# Patient Record
Sex: Female | Born: 1980 | Race: White | Hispanic: No | Marital: Married | Smoking: Former smoker
Health system: Southern US, Community
[De-identification: ages and names within clinical notes are randomized; demographics above are authoritative.]

## PROBLEM LIST (undated history)

## (undated) ENCOUNTER — Inpatient Hospital Stay (HOSPITAL_COMMUNITY): Payer: Self-pay

## (undated) DIAGNOSIS — E039 Hypothyroidism, unspecified: Secondary | ICD-10-CM

## (undated) DIAGNOSIS — F329 Major depressive disorder, single episode, unspecified: Secondary | ICD-10-CM

## (undated) DIAGNOSIS — T7840XA Allergy, unspecified, initial encounter: Secondary | ICD-10-CM

## (undated) DIAGNOSIS — F32A Depression, unspecified: Secondary | ICD-10-CM

## (undated) DIAGNOSIS — B009 Herpesviral infection, unspecified: Secondary | ICD-10-CM

## (undated) DIAGNOSIS — F419 Anxiety disorder, unspecified: Secondary | ICD-10-CM

## (undated) DIAGNOSIS — O34219 Maternal care for unspecified type scar from previous cesarean delivery: Secondary | ICD-10-CM

## (undated) HISTORY — DX: Depression, unspecified: F32.A

## (undated) HISTORY — DX: Major depressive disorder, single episode, unspecified: F32.9

## (undated) HISTORY — DX: Hypothyroidism, unspecified: E03.9

## (undated) HISTORY — DX: Maternal care for unspecified type scar from previous cesarean delivery: O34.219

## (undated) HISTORY — PX: WISDOM TOOTH EXTRACTION: SHX21

## (undated) HISTORY — DX: Allergy, unspecified, initial encounter: T78.40XA

## (undated) HISTORY — DX: Herpesviral infection, unspecified: B00.9

## (undated) HISTORY — PX: INDUCED ABORTION: SHX677

## (undated) HISTORY — DX: Anxiety disorder, unspecified: F41.9

---
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
## 2006-01-26 ENCOUNTER — Emergency Department (HOSPITAL_COMMUNITY): Admission: EM | Admit: 2006-01-26 | Discharge: 2006-01-26 | Payer: Self-pay | Admitting: Emergency Medicine

## 2009-04-19 ENCOUNTER — Emergency Department (HOSPITAL_COMMUNITY): Admission: EM | Admit: 2009-04-19 | Discharge: 2009-04-19 | Payer: Self-pay | Admitting: Emergency Medicine

## 2010-01-11 IMAGING — CR DG CHEST 1V PORT
1 series · 1 of 1 positions shown · non-contrast
Comparison: None

CLINICAL DATA: Altered mental status

PORTABLE CHEST - 1 VIEW

[AP]
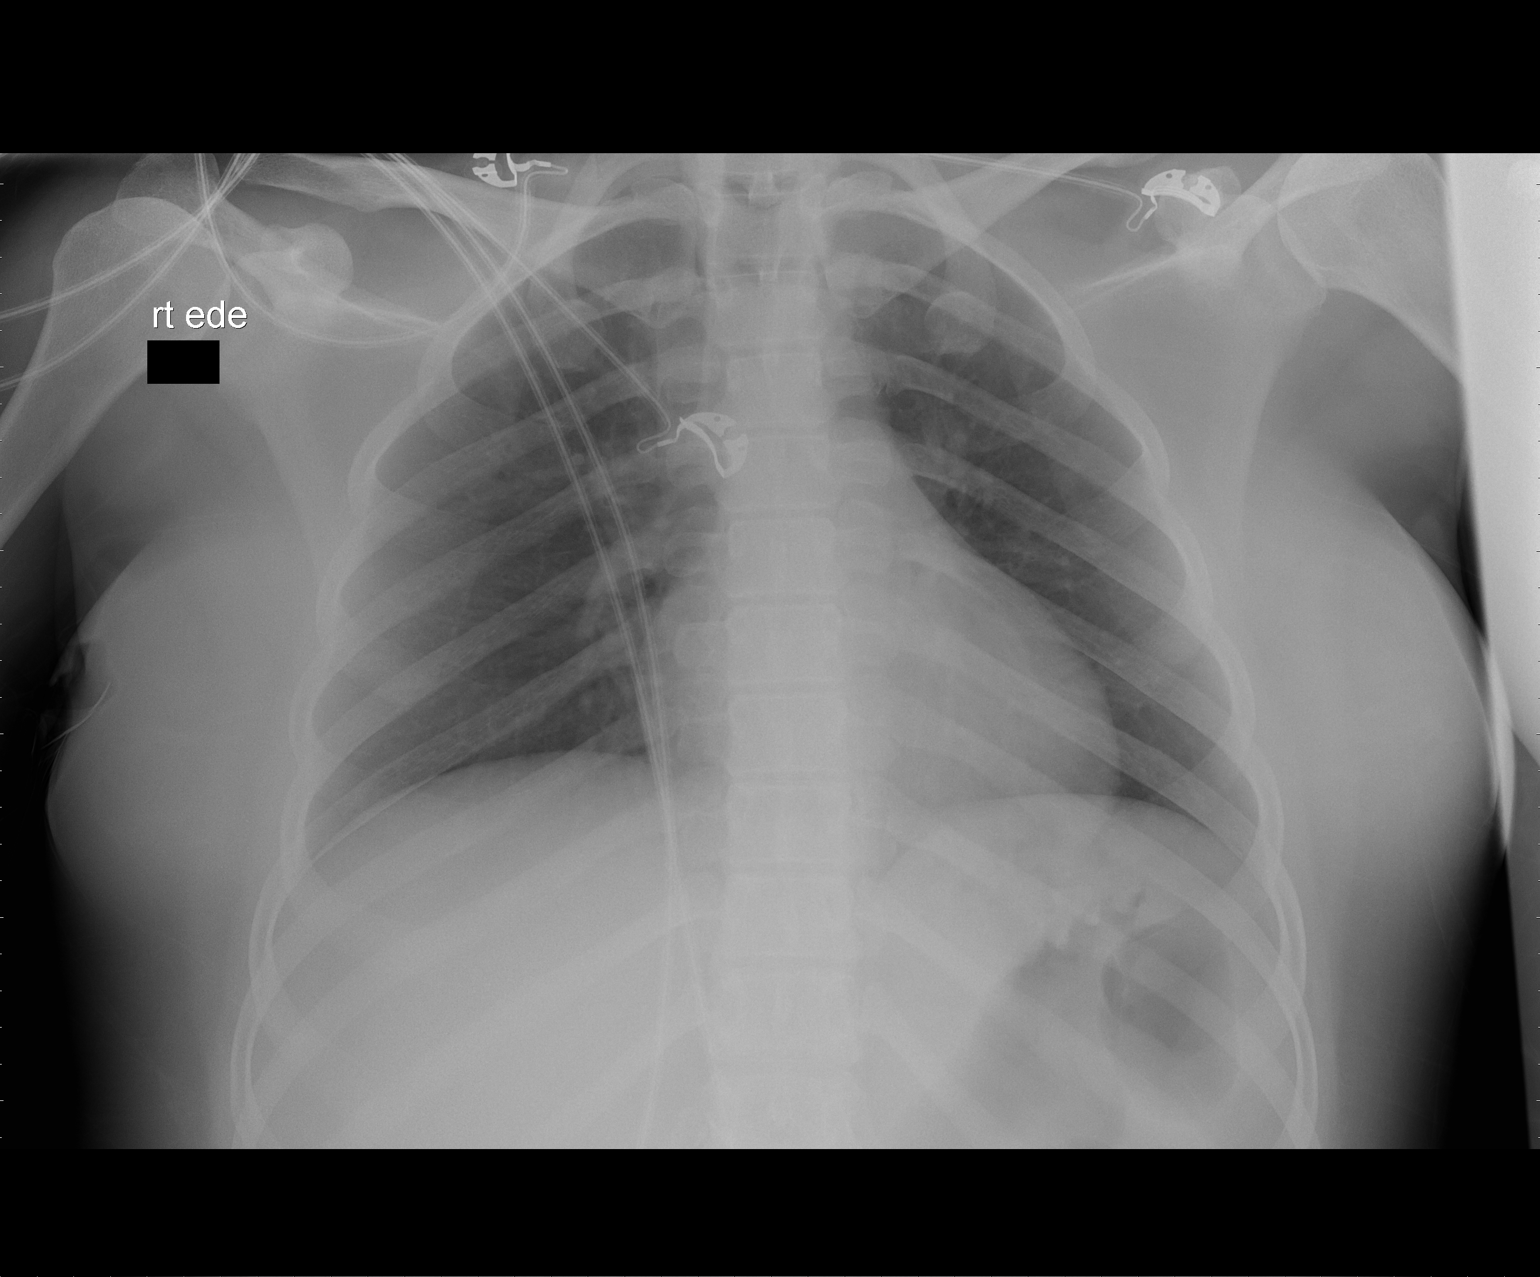

[1 of 1 positions shown; findings below may reference images not displayed]

FINDINGS: Heart and mediastinal contours normal.  Lungs clear.
Osseous structures intact in one-view.
IMPRESSION: No acute or significant findings.

## 2010-01-11 IMAGING — CT CT HEAD W/O CM
3 of 4 series · 18 of 30 positions shown, 20 images · non-contrast
Comparison: None

CLINICAL DATA: Altered mental status ; question overmedication.

CT HEAD WITHOUT CONTRAST
TECHNIQUE: Contiguous axial images were obtained from the base of
the skull through the vertex without contrast.

[Series 2: head routine 4.8 h37s · axial · 0.47mm/px · z∈[-116,-4]mm · 7 of 30 slices shown, 9 images (1 of 2)]
[im 4/30  brain]
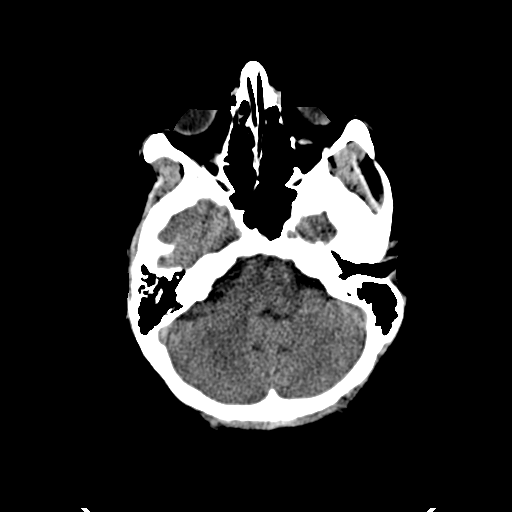
[im 4/30  bone]
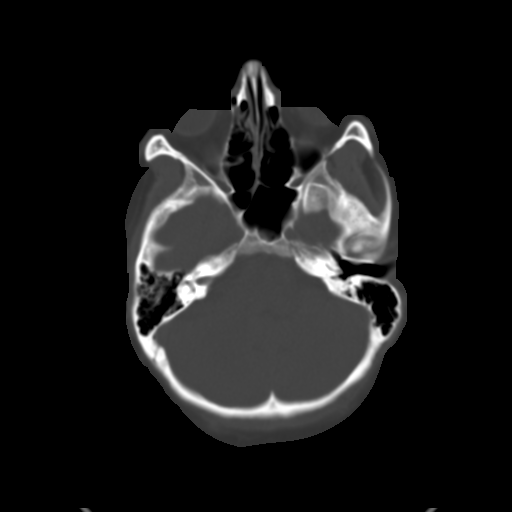
[im 8/30  brain]
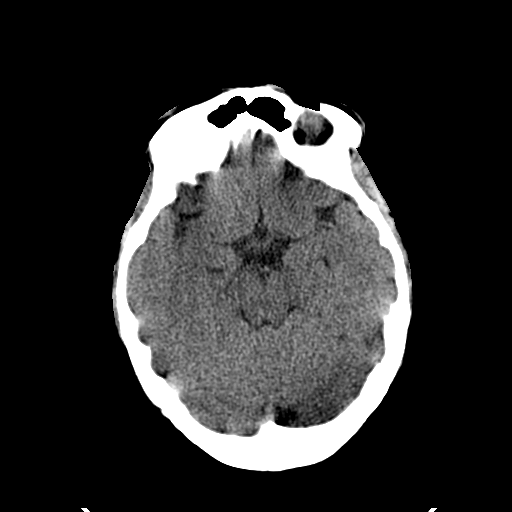
[im 11/30  brain]
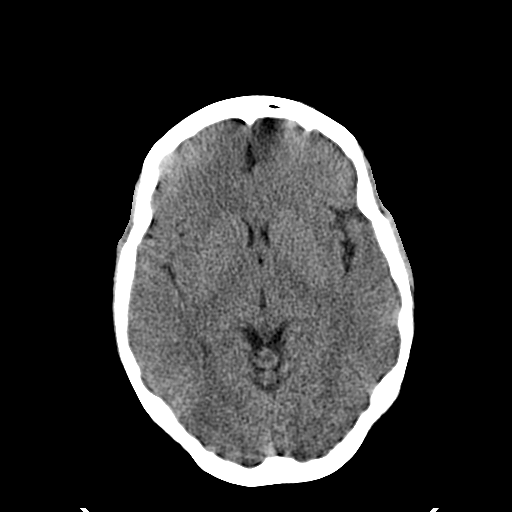
[im 15/30  brain]
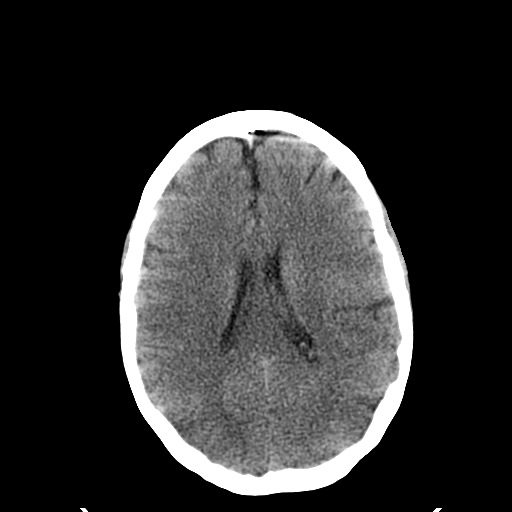
[im 19/30  brain]
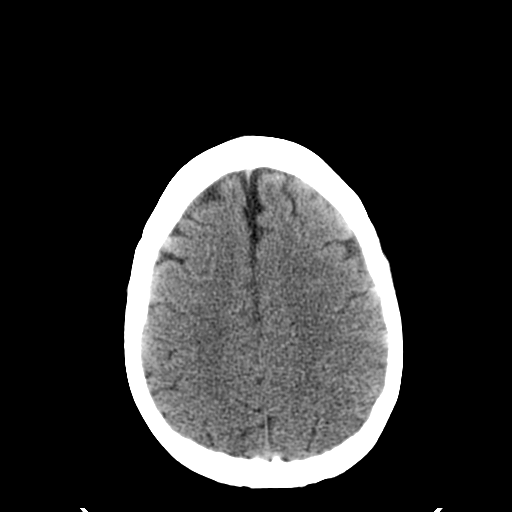
[im 19/30  bone]
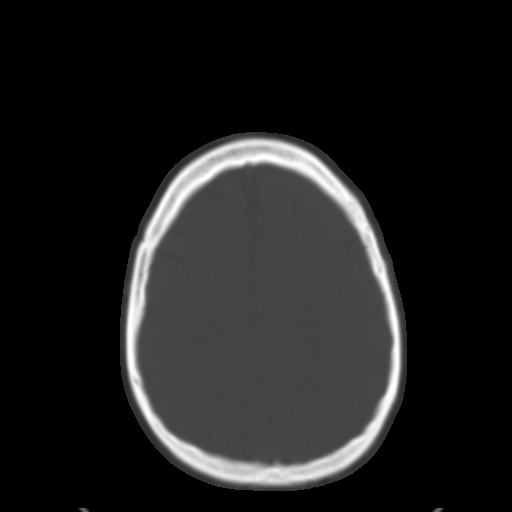
[im 22/30  brain]
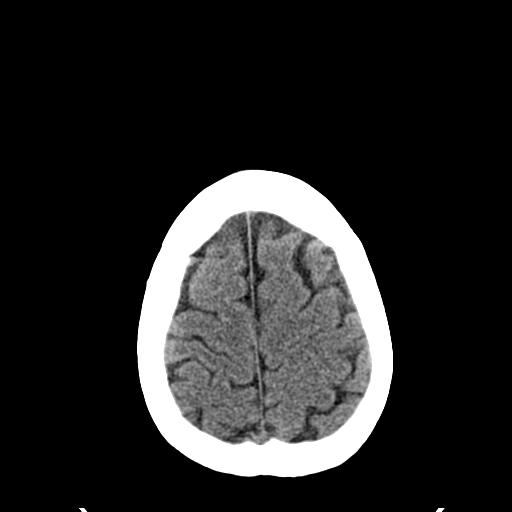
[im 26/30  brain]
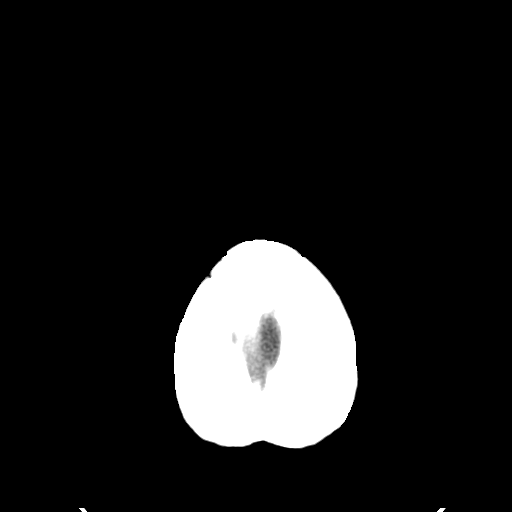

[Series 3: head routine 4.8 h37s · axial · 0.47mm/px · z∈[-96,-45]mm · 4 of 18 slices shown (2 of 2)]
[im 4/18  brain]
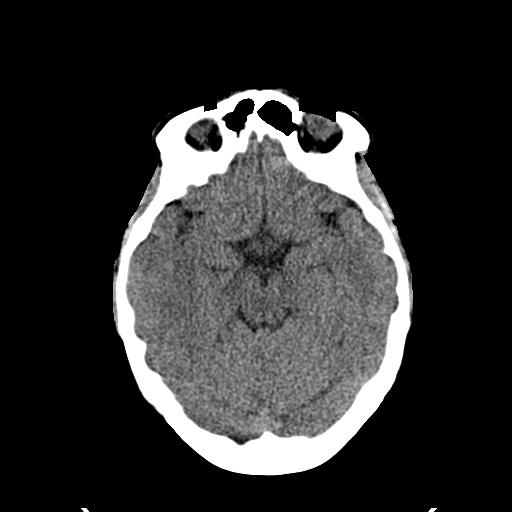
[im 7/18  brain]
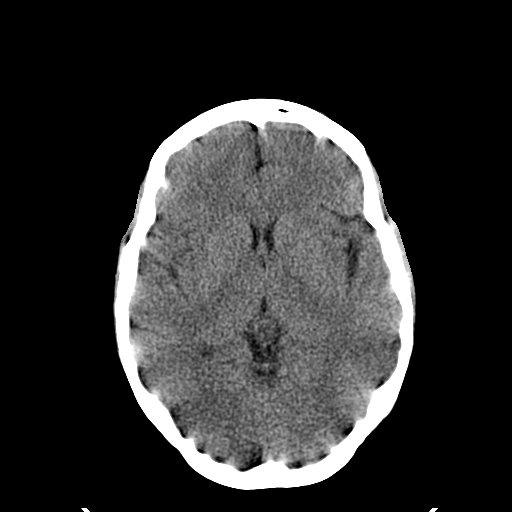
[im 11/18  brain]
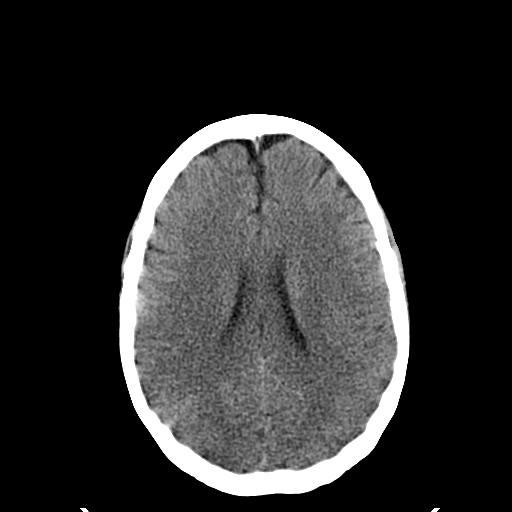
[im 14/18  brain]
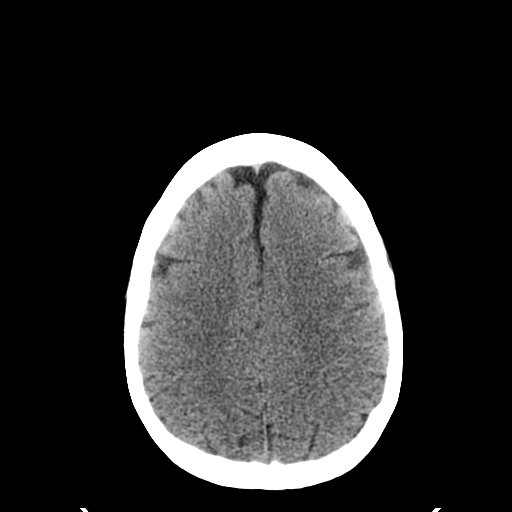

[Series 4: head routine 4.8 h60s · axial · 0.43mm/px · z∈[-122,-10]mm · 7 of 30 slices shown]
[im 4/30  brain]
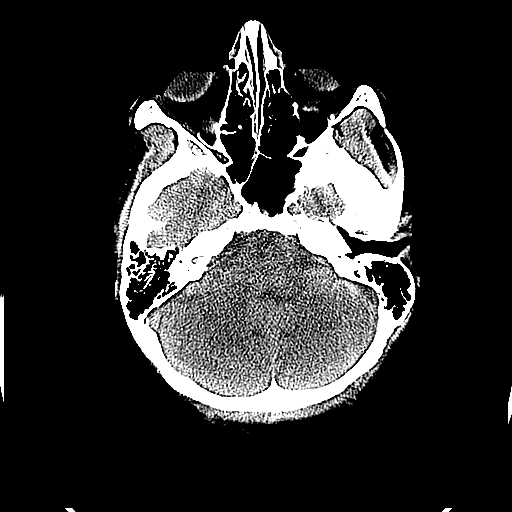
[im 8/30  brain]
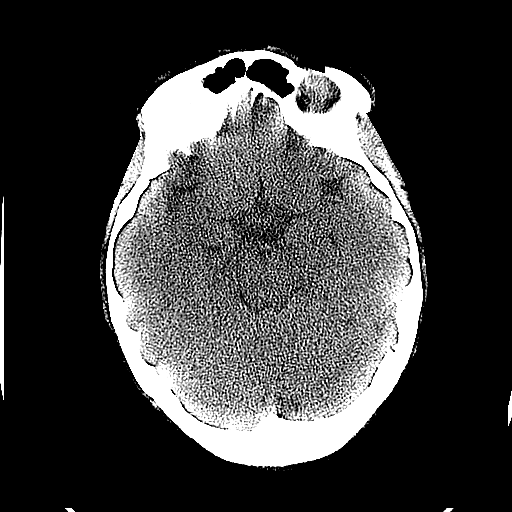
[im 11/30  brain]
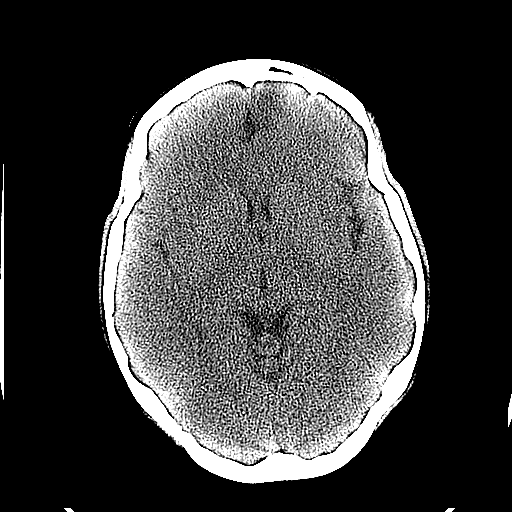
[im 15/30  brain]
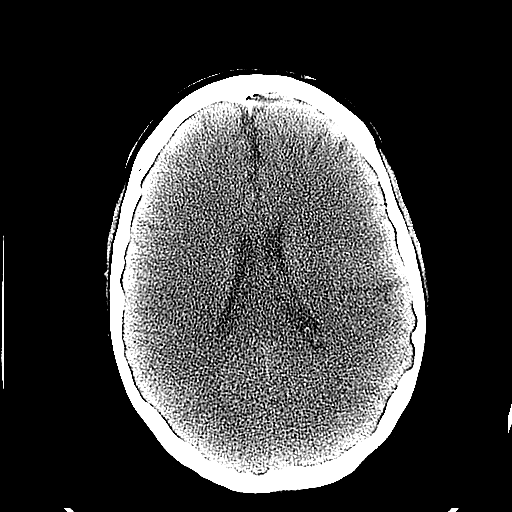
[im 19/30  brain]
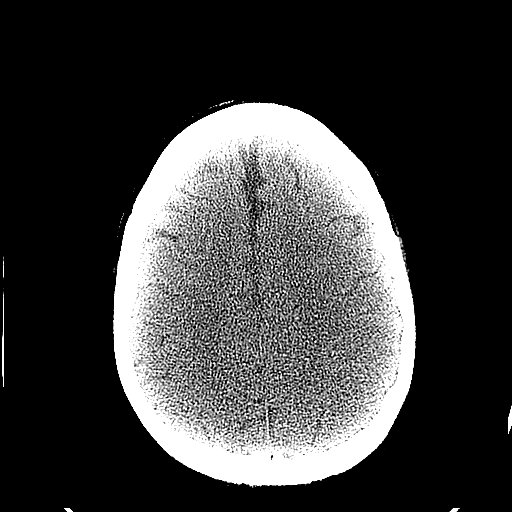
[im 22/30  brain]
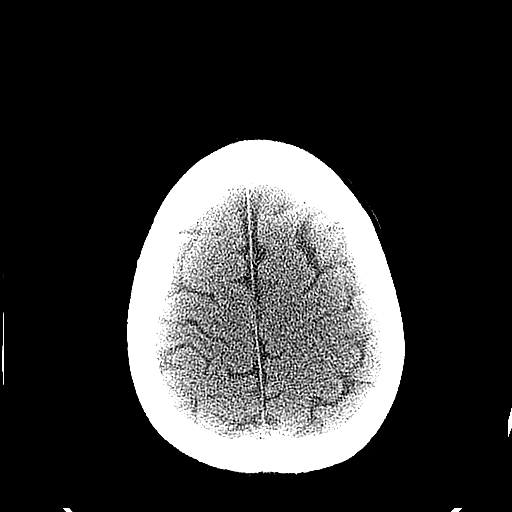
[im 26/30  brain]
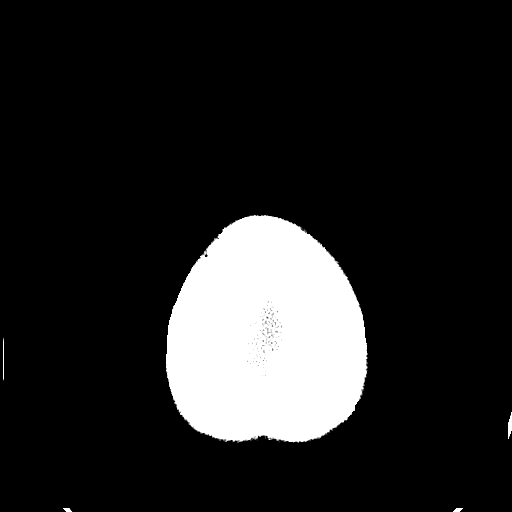

[18 of 30 positions shown; findings below may reference images not displayed]

FINDINGS: No acute findings.  No hemorrhage, mass lesion, or acute
infarction.  Bilateral upper frontal cortical atrophic changes for
age.
IMPRESSION: No acute intracranial abnormality.  See comments above.

## 2010-08-13 ENCOUNTER — Inpatient Hospital Stay (HOSPITAL_COMMUNITY): Admission: AD | Admit: 2010-08-13 | Discharge: 2010-08-17 | Payer: Self-pay | Admitting: Obstetrics and Gynecology

## 2010-11-29 LAB — CBC
Hemoglobin: 9 g/dL — ABNORMAL LOW (ref 12.0–15.0)
MCH: 29.2 pg (ref 26.0–34.0)
MCH: 29.2 pg (ref 26.0–34.0)
MCHC: 33.6 g/dL (ref 30.0–36.0)
MCHC: 33.7 g/dL (ref 30.0–36.0)
MCV: 86.6 fL (ref 78.0–100.0)
MCV: 86.9 fL (ref 78.0–100.0)
Platelets: 208 10*3/uL (ref 150–400)
RBC: 3.07 MIL/uL — ABNORMAL LOW (ref 3.87–5.11)
RBC: 3.89 MIL/uL (ref 3.87–5.11)

## 2010-11-29 LAB — RPR: RPR Ser Ql: NONREACTIVE

## 2010-12-25 LAB — DIFFERENTIAL
Eosinophils Absolute: 0 10*3/uL (ref 0.0–0.7)
Lymphs Abs: 0.9 10*3/uL (ref 0.7–4.0)
Monocytes Absolute: 0.4 10*3/uL (ref 0.1–1.0)
Monocytes Relative: 3 % (ref 3–12)
Neutrophils Relative %: 91 % — ABNORMAL HIGH (ref 43–77)

## 2010-12-25 LAB — COMPREHENSIVE METABOLIC PANEL
ALT: 25 U/L (ref 0–35)
AST: 56 U/L — ABNORMAL HIGH (ref 0–37)
Albumin: 3.8 g/dL (ref 3.5–5.2)
Calcium: 8.9 mg/dL (ref 8.4–10.5)
GFR calc Af Amer: >60 mL/min (ref >60)
Glucose, Bld: 108 mg/dL — ABNORMAL HIGH (ref 70–99)
Sodium: 138 mEq/L (ref 135–145)
Total Protein: 6.6 g/dL (ref 6.0–8.3)

## 2010-12-25 LAB — URINALYSIS, ROUTINE W REFLEX MICROSCOPIC
Bilirubin Urine: NEGATIVE
Glucose, UA: NEGATIVE mg/dL
Ketones, ur: NEGATIVE mg/dL
Protein, ur: NEGATIVE mg/dL
pH: 5.5 (ref 5.0–8.0)

## 2010-12-25 LAB — CBC
MCHC: 34 g/dL (ref 30.0–36.0)
Platelets: 225 10*3/uL (ref 150–400)
RDW: 12.7 % (ref 11.5–15.5)

## 2010-12-25 LAB — RAPID URINE DRUG SCREEN, HOSP PERFORMED
Amphetamines: NOT DETECTED
Benzodiazepines: POSITIVE — AB
Cocaine: NOT DETECTED
Tetrahydrocannabinol: POSITIVE — AB

## 2010-12-25 LAB — ETHANOL: Alcohol, Ethyl (B): <5 mg/dL (ref 0–10)

## 2010-12-25 LAB — POCT PREGNANCY, URINE: Preg Test, Ur: NEGATIVE

## 2010-12-25 LAB — GLUCOSE, CAPILLARY: Glucose-Capillary: 104 mg/dL — ABNORMAL HIGH (ref 70–99)

## 2010-12-25 LAB — ACETAMINOPHEN LEVEL: Acetaminophen (Tylenol), Serum: <10 ug/mL — ABNORMAL LOW (ref 10–30)

## 2012-01-02 ENCOUNTER — Ambulatory Visit: Payer: Self-pay | Admitting: Physician Assistant

## 2012-01-02 VITALS — BP 114/76 | HR 98 | Temp 98.2°F | Resp 16 | Ht 60.0 in | Wt 131.4 lb

## 2012-01-02 DIAGNOSIS — B9689 Other specified bacterial agents as the cause of diseases classified elsewhere: Secondary | ICD-10-CM

## 2012-01-02 DIAGNOSIS — N898 Other specified noninflammatory disorders of vagina: Secondary | ICD-10-CM

## 2012-01-02 DIAGNOSIS — Z01419 Encounter for gynecological examination (general) (routine) without abnormal findings: Secondary | ICD-10-CM

## 2012-01-02 DIAGNOSIS — N760 Acute vaginitis: Secondary | ICD-10-CM

## 2012-01-02 LAB — POCT WET PREP WITH KOH
KOH Prep POC: NEGATIVE
Trichomonas, UA: NEGATIVE
Yeast Wet Prep HPF POC: NEGATIVE

## 2012-01-02 MED ORDER — METRONIDAZOLE 500 MG PO TABS: 500.0000 mg | ORAL_TABLET | Freq: Two times a day (BID) | ORAL | Status: AC

## 2012-01-02 MED ORDER — FLUCONAZOLE 150 MG PO TABS: 150.0000 mg | ORAL_TABLET | Freq: Once | ORAL | Status: AC

## 2012-01-02 NOTE — Patient Instructions (Signed)
Since you are not actively preventing pregnancy, even though your are not actively trying to BE pregnant, please start a folic acid supplement, and continue working on quitting smoking.

## 2012-01-02 NOTE — Progress Notes (Signed)
  Subjective:    Patient ID: Dorothy Brown, female    DOB: [birth year], 31 y.o.   MRN: [id number]  HPI  Presents with several days of itchy vaginal discharge.  No pelvic pain, no urinary symptoms.  No GI symptoms.  Feels like BV she's had previously.  One female sexual partner.  No contraception. Typically gets yeast infections after antibiotics.  Daughter, Dorothy Brown, born 08/14/2010. The father doubts his paternity and has not participated in parenting in any way.  Review of Systems As above.    Objective:   Physical Exam Vital signs noted. Well-developed, well nourished WF who is awake, alert and oriented, in NAD. HEENT: [place]/AT, sclera and conjunctiva are clear.   Neck: supple, non-tender, no lymphadenopathy, thyromegaly. Heart: RRR, no murmur Lungs: CTA Abdomen: normo-active bowel sounds, supple, non-tender, no mass or organomegaly. Gynecologic: normal female external genitalia.  Vaginal mucosa is pink, moist, without lesion.  Scant fluid in the vaginal vault.  Cervix appears healthy.  Results for orders placed in visit on 01/02/12  POCT WET PREP WITH KOH      Component Value Range   Trichomonas, UA Negative     Clue Cells Wet Prep HPF POC 4-6     Epithelial Wet Prep HPF POC 10-13     Yeast Wet Prep HPF POC neg     Bacteria Wet Prep HPF POC trace     RBC Wet Prep HPF POC 0-2     WBC Wet Prep HPF POC 1-3     KOH Prep POC Negative    POCT URINE PREGNANCY      Component Value Range   Preg Test, Ur Negative          Assessment & Plan:   1. Leukorrhea, not specified as infective  POCT Wet Prep with KOH, POCT urine pregnancy, fluconazole (DIFLUCAN) 150 MG tablet  2. Routine gynecological examination  Pap IG (Image Guided)  3. BV (bacterial vaginosis)  metroNIDAZOLE (FLAGYL) 500 MG tablet   Encouraged smoking cessation and folic acid supplementation since she is not actively preventing pregnancy. Continue other treatment as before for depression/anxiety.

## 2012-01-05 LAB — PAP IG (IMAGE GUIDED)

## 2012-03-22 ENCOUNTER — Other Ambulatory Visit: Payer: Self-pay | Admitting: Family Medicine

## 2012-05-27 ENCOUNTER — Other Ambulatory Visit: Payer: Self-pay | Admitting: Physician Assistant

## 2012-05-27 NOTE — Telephone Encounter (Signed)
OK X 1, NEEDS OV FOR MORE? 

## 2012-05-31 ENCOUNTER — Telehealth: Payer: Self-pay

## 2012-12-20 ENCOUNTER — Encounter (HOSPITAL_COMMUNITY): Payer: Self-pay

## 2012-12-20 ENCOUNTER — Inpatient Hospital Stay (HOSPITAL_COMMUNITY): Payer: Medicaid Other

## 2012-12-20 ENCOUNTER — Inpatient Hospital Stay (HOSPITAL_COMMUNITY)
Admission: AD | Admit: 2012-12-20 | Discharge: 2012-12-20 | Disposition: A | Discharge status: Home / Self Care | Payer: Medicaid Other | Source: Ambulatory Visit | Attending: Obstetrics and Gynecology | Admitting: Obstetrics and Gynecology

## 2012-12-20 DIAGNOSIS — B3731 Acute candidiasis of vulva and vagina: Secondary | ICD-10-CM | POA: Insufficient documentation

## 2012-12-20 DIAGNOSIS — O209 Hemorrhage in early pregnancy, unspecified: Secondary | ICD-10-CM | POA: Insufficient documentation

## 2012-12-20 DIAGNOSIS — O2390 Unspecified genitourinary tract infection in pregnancy, unspecified trimester: Secondary | ICD-10-CM | POA: Insufficient documentation

## 2012-12-20 DIAGNOSIS — R109 Unspecified abdominal pain: Secondary | ICD-10-CM | POA: Insufficient documentation

## 2012-12-20 DIAGNOSIS — B373 Candidiasis of vulva and vagina: Secondary | ICD-10-CM | POA: Insufficient documentation

## 2012-12-20 DIAGNOSIS — A499 Bacterial infection, unspecified: Secondary | ICD-10-CM | POA: Insufficient documentation

## 2012-12-20 DIAGNOSIS — N760 Acute vaginitis: Secondary | ICD-10-CM | POA: Insufficient documentation

## 2012-12-20 DIAGNOSIS — O418X11 Other specified disorders of amniotic fluid and membranes, first trimester, fetus 1: Secondary | ICD-10-CM

## 2012-12-20 DIAGNOSIS — B9689 Other specified bacterial agents as the cause of diseases classified elsewhere: Secondary | ICD-10-CM | POA: Insufficient documentation

## 2012-12-20 LAB — URINE MICROSCOPIC-ADD ON

## 2012-12-20 LAB — URINALYSIS, ROUTINE W REFLEX MICROSCOPIC
Glucose, UA: NEGATIVE mg/dL
Ketones, ur: NEGATIVE mg/dL
Protein, ur: NEGATIVE mg/dL

## 2012-12-20 LAB — HCG, QUANTITATIVE, PREGNANCY: hCG, Beta Chain, Quant, S: 2582 m[IU]/mL — ABNORMAL HIGH (ref <5)

## 2012-12-20 LAB — WET PREP, GENITAL

## 2012-12-20 MED ORDER — FLUCONAZOLE 150 MG PO TABS: 150.0000 mg | ORAL_TABLET | Freq: Once | ORAL | Status: DC

## 2012-12-20 MED ORDER — METRONIDAZOLE 500 MG PO TABS: 500.0000 mg | ORAL_TABLET | Freq: Two times a day (BID) | ORAL | Status: DC

## 2012-12-20 NOTE — MAU Note (Signed)
Patient presents to MAU with c/o intermittent lower abdominal cramping throughout this week; reports spotting began yesterday; today she is only spotting when she wipes.  Reports she plans care with [facility]; they were not able to see her today, advised to come here for evaluation.

## 2012-12-20 NOTE — MAU Note (Signed)
Patient states she has had a positive pregnancy test at [facility] but has not started her care. States she had light spotting yesterday and has some abdominal cramping on and off. No spotting today.

## 2012-12-20 NOTE — MAU Provider Note (Signed)
History     CSN: [id number]  Arrival date and time: 12/20/12 1016   First Provider Initiated Contact with Patient 12/20/12 1152      Chief Complaint  Patient presents with  . Abdominal Pain   HPI [name] is a 32 y.o. G3P1011 at [redacted]w[redacted]d who presents today for vaginal bleeding and abdominal pain. The patient states that she has had cramping x 1 week. This is more than she expected based on her previous pregnancy. She had some light spotting yesterday and today. She denies abnormal discharge otherwise. No N/V or fever. She has not had intercourse or an exam recently.    OB History   Grav Para Term Preterm Abortions TAB SAB Ect Mult Living   3 1 1  1 1    1       Past Medical History  Diagnosis Date  . Depression   . Anxiety   . Allergy   . Hypothyroidism   . 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  No family history on file.  History  Substance Use Topics  . Smoking status: Current Every Day Smoker -- 1.00 packs/day    Types: Cigarettes  . Smokeless tobacco: Not on file  . Alcohol Use: Not on file    Allergies: No Known Allergies  Prescriptions prior to admission  Medication Sig Dispense Refill  . Prenatal Vit-Fe Fumarate-FA (PRENATAL MULTIVITAMIN) TABS Take 1 tablet by mouth daily at 12 noon.      . sertraline (ZOLOFT) 100 MG tablet TAKE 1 TABLET BY MOUTH DAILY  30 tablet  0    Review of Systems  Constitutional: Negative for fever and malaise/fatigue.  Gastrointestinal: Positive for abdominal pain. Negative for nausea, vomiting and diarrhea.  Genitourinary: Negative for dysuria, urgency and frequency.       + vaginal bleeding Neg - abnormal discharge  Neurological: Negative for dizziness, loss of consciousness and headaches.   Physical Exam   Blood pressure 134/77, pulse 100, temperature 98.5 F (36.9 C), temperature source Oral, resp. rate 16, height 5' (1.524 m), weight 158 lb 6.4 oz (71.85 kg), last menstrual  period 10/28/2012, SpO2 100.00%.  Physical Exam  Constitutional: She is oriented to person, place, and time. She appears well-developed and well-nourished. No distress.  HENT:  Head: Normocephalic and atraumatic.  Cardiovascular: Normal rate, regular rhythm and normal heart sounds.   Respiratory: Effort normal and breath sounds normal. No respiratory distress.  GI: Soft. Bowel sounds are normal. She exhibits no distension and no mass. There is tenderness (mild lower abdominal tenderness to palpation). There is no rebound and no guarding.  Genitourinary: Vagina normal. Uterus is not enlarged and not tender. Cervix exhibits discharge (small amount of mucus and dark blood discharge at cervical os and in the vagina). Cervix exhibits no motion tenderness and no friability. Right adnexum displays no mass and no tenderness. Left adnexum displays no mass and no tenderness.  Neurological: She is alert and oriented to person, place, and time.  Skin: Skin is warm and dry. No erythema.  Psychiatric: She has a normal mood and affect.   Results for orders placed during the hospital encounter of 12/20/12 (from the past 24 hour(s))  URINALYSIS, ROUTINE W REFLEX MICROSCOPIC     Status: Abnormal   Collection Time    12/20/12 10:55 AM      Result Value Range   Color, Urine YELLOW  YELLOW   APPearance TURBID (*)  CLEAR   Specific Gravity, Urine 1.020  1.005 - 1.030   pH 7.0  5.0 - 8.0   Glucose, UA NEGATIVE  NEGATIVE mg/dL   Hgb urine dipstick LARGE (*) NEGATIVE   Bilirubin Urine NEGATIVE  NEGATIVE   Ketones, ur NEGATIVE  NEGATIVE mg/dL   Protein, ur NEGATIVE  NEGATIVE mg/dL   Urobilinogen, UA 0.2  0.0 - 1.0 mg/dL   Nitrite NEGATIVE  NEGATIVE   Leukocytes, UA SMALL (*) NEGATIVE  URINE MICROSCOPIC-ADD ON     Status: Abnormal   Collection Time    12/20/12 10:55 AM      Result Value Range   Squamous Epithelial / LPF RARE  RARE   WBC, UA 3-6  <3 WBC/hpf   Bacteria, UA MANY (*) RARE   Urine-Other  AMORPHOUS URATES/PHOSPHATES    WET PREP, GENITAL     Status: Abnormal   Collection Time    12/20/12 12:07 PM      Result Value Range   Yeast Wet Prep HPF POC FEW (*) NONE SEEN   Trich, Wet Prep NONE SEEN  NONE SEEN   Clue Cells Wet Prep HPF POC FEW (*) NONE SEEN   WBC, Wet Prep HPF POC MODERATE (*) NONE SEEN    MAU Course  Procedures None  MDM Discussed patient with Dr. Jackelyn Knife. He would like [place] today to confirm IUP.  Discussed [place] results with Dr. Jackelyn Knife. He would like quant bhCG today and for patient to follow-up in the office on Monday for repeat labs and plan for follow-up  Assessment and Plan  A: IUGS at 5w 1d without YS or FP Bacterial vaginosis Yeast vaginitis Subchorionic hemorrhage  P: Discharge home Rx for Flagyl and Diflucan sent to patient's pharmacy Patient advised to follow-up in the office on Monday for follow-up labs Bleeding precautions discussed Patient may return to MAU as needed or if her condition were to change or worsen  Freddi Starr, PA-C  12/20/2012, 2:27 PM

## 2012-12-21 ENCOUNTER — Encounter (HOSPITAL_COMMUNITY): Payer: Self-pay | Admitting: *Deleted

## 2012-12-21 ENCOUNTER — Inpatient Hospital Stay (HOSPITAL_COMMUNITY)
Admission: AD | Admit: 2012-12-21 | Discharge: 2012-12-21 | Disposition: A | Discharge status: Home / Self Care | Payer: Medicaid Other | Source: Ambulatory Visit | Attending: Obstetrics and Gynecology | Admitting: Obstetrics and Gynecology

## 2012-12-21 DIAGNOSIS — R109 Unspecified abdominal pain: Secondary | ICD-10-CM | POA: Insufficient documentation

## 2012-12-21 DIAGNOSIS — O200 Threatened abortion: Secondary | ICD-10-CM | POA: Insufficient documentation

## 2012-12-21 LAB — URINE CULTURE

## 2012-12-21 LAB — GC/CHLAMYDIA PROBE AMP: CT Probe RNA: NEGATIVE

## 2012-12-21 MED ORDER — KETOROLAC TROMETHAMINE 60 MG/2ML IM SOLN
60.0000 mg | Freq: Once | INTRAMUSCULAR | Status: AC
  Administered 2012-12-21: 60 mg via INTRAMUSCULAR
  Filled 2012-12-21: qty 2

## 2012-12-21 NOTE — Progress Notes (Signed)
Heather Hogan CNM notified of pt's admission and status. Will see pt 

## 2012-12-21 NOTE — MAU Note (Signed)
I woke up at 0400 and went to BR. Had bright bleeding and some clots in toilet. Stood up and got dizzy and passed out for few seconds. Cont to have bleeding and clots

## 2012-12-21 NOTE — MAU Provider Note (Signed)
  History     CSN: [id number]  Arrival date and time: 12/21/12 0501   First Provider Initiated Contact with Patient 12/21/12 [phone]      Chief Complaint  Patient presents with  . Abdominal Pain  . Vaginal Bleeding   HPI  Dorothy Brown is a 32 y.o. G3P1011 at [redacted]w[redacted]d who presents today with vaginal bleeding. She states that around 0430 she woke to menstrual like bleeding. She has not passed any clots or tissue. Her and her partner report that she may have "passed out". When she iniatially woke up and then she was snoring again. Her partner states that when he shook her she woke up right away. She is also having 8/10 menstrual cramping like pain.   Past Medical History  Diagnosis Date  . Depression   . Anxiety   . Allergy   . Hypothyroidism   . 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 Cesarean section      Family History  Problem Relation Age of Onset  . Arthritis Mother   . Diabetes Father   . Cancer Maternal Uncle     History  Substance Use Topics  . Smoking status: Current Every Day Smoker -- 1.00 packs/day    Types: Cigarettes  . Smokeless tobacco: Not on file  . Alcohol Use: No    Allergies: No Known Allergies  Prescriptions prior to admission  Medication Sig Dispense Refill  . Prenatal Vit-Fe Fumarate-FA (PRENATAL MULTIVITAMIN) TABS Take 1 tablet by mouth daily at 12 noon.      . sertraline (ZOLOFT) 100 MG tablet TAKE 1 TABLET BY MOUTH DAILY  30 tablet  0  . fluconazole (DIFLUCAN) 150 MG tablet Take 1 tablet (150 mg total) by mouth once.  1 tablet  0  . metroNIDAZOLE (FLAGYL) 500 MG tablet Take 1 tablet (500 mg total) by mouth 2 (two) times daily.  14 tablet  0    Review of Systems  Constitutional: Negative for fever.  Eyes: Negative for blurred vision.  Respiratory: Negative for shortness of breath.   Cardiovascular: Negative for chest pain.  Gastrointestinal: Positive for abdominal pain. Negative for  nausea and vomiting.  Genitourinary: Negative for dysuria.  Neurological: Negative for dizziness.   Physical Exam   Blood pressure 115/68, pulse 92, temperature 98.3 F (36.8 C), resp. rate 20, height 5' (1.524 m), weight 158 lb (71.668 kg), last menstrual period 10/28/2012, SpO2 99.00%.  Physical Exam  Nursing note and vitals reviewed. Constitutional: She is oriented to person, place, and time. She appears well-developed and well-nourished. No distress.  Cardiovascular: Normal rate.   Respiratory: Effort normal.  GI: Soft. There is no tenderness.  Genitourinary:   External: no lesion Vagina: small amount of dark red blood seen Cervix: FT, no CMT Uterus: RV, slightly enlarged   Neurological: She is alert and oriented to person, place, and time.  Skin: Skin is warm and dry.  Psychiatric: She has a normal mood and affect.    MAU Course  Procedures  0553: Spoke with Dr. Lorrene Reid. Plan to give toradol IM and DC patient home.   Assessment and Plan   1. Threatened abortion in first trimester    FU with Dr. Eligha Bridegroom office Reviewed SAB precautions and normal course.   [name] 12/21/2012, 5:53 AM

## 2012-12-21 NOTE — Progress Notes (Signed)
Spec exam and bimanual done.  Tol well

## 2012-12-21 NOTE — Progress Notes (Signed)
Heather Hogan CNM in to discuss d/c plan. Written and verbal d/c instructions given and understanding voiced. 

## 2013-08-07 NOTE — Telephone Encounter (Signed)
error 

## 2013-10-25 ENCOUNTER — Encounter (HOSPITAL_COMMUNITY): Payer: Self-pay | Admitting: *Deleted

## 2014-06-25 ENCOUNTER — Encounter (HOSPITAL_COMMUNITY): Payer: Self-pay

## 2014-06-25 ENCOUNTER — Inpatient Hospital Stay (HOSPITAL_COMMUNITY)
Admission: AD | Admit: 2014-06-25 | Discharge: 2014-06-25 | Disposition: A | Discharge status: Home / Self Care | Payer: Medicaid Other | Source: Ambulatory Visit | Attending: Obstetrics and Gynecology | Admitting: Obstetrics and Gynecology

## 2014-06-25 DIAGNOSIS — Z3491 Encounter for supervision of normal pregnancy, unspecified, first trimester: Secondary | ICD-10-CM | POA: Insufficient documentation

## 2014-06-25 DIAGNOSIS — Z331 Pregnant state, incidental: Secondary | ICD-10-CM

## 2014-06-25 DIAGNOSIS — Z3492 Encounter for supervision of normal pregnancy, unspecified, second trimester: Secondary | ICD-10-CM

## 2014-06-25 NOTE — MAU Note (Signed)
Patient states she has not started prenatal care [facility] Has had positive pregnancy test at [facility]. Patient denies any problems, nausea, vomiting, bleeding, discharge or pain. Wants to have a check up.

## 2014-07-14 LAB — OB RESULTS CONSOLE HIV ANTIBODY (ROUTINE TESTING): HIV: NONREACTIVE

## 2014-07-14 LAB — OB RESULTS CONSOLE RUBELLA ANTIBODY, IGM: Rubella: IMMUNE

## 2014-07-14 LAB — OB RESULTS CONSOLE RPR: RPR: NONREACTIVE

## 2014-07-14 LAB — OB RESULTS CONSOLE ANTIBODY SCREEN: Antibody Screen: NEGATIVE

## 2014-07-14 LAB — OB RESULTS CONSOLE GC/CHLAMYDIA
CHLAMYDIA, DNA PROBE: NEGATIVE
Gonorrhea: NEGATIVE

## 2014-07-14 LAB — OB RESULTS CONSOLE HEPATITIS B SURFACE ANTIGEN: Hepatitis B Surface Ag: NEGATIVE

## 2014-07-20 ENCOUNTER — Encounter (HOSPITAL_COMMUNITY): Payer: Self-pay

## 2014-09-18 HISTORY — DX: Maternal care for unspecified type scar from previous cesarean delivery: O34.219

## 2014-09-18 NOTE — L&D Delivery Note (Signed)
Delivery Note Pt pushed for about 10 minutes and at 1:08 PM a viable female was delivered via Vaginal, Spontaneous Delivery (Presentation: Left Occiput Anterior).  APGAR: 8, 9; weight pending .   Placenta status: Intact, Spontaneous.  Cord: 3 vessels with the following complications: None.    Anesthesia: None  Episiotomy: None Lacerations: None Suture Repair: n/a Est. Blood Loss (mL): 250  Mom to postpartum.  Baby to Couplet care / Skin to Skin  Baby a bit sleepy from Stadol given prior to delivery, RN observing for need for narcan.  Oliver PilaRICHARDSON,Dorothy Brown 12/10/2014, 1:41 PM

## 2014-12-03 LAB — OB RESULTS CONSOLE GBS: GBS: NEGATIVE

## 2014-12-10 ENCOUNTER — Encounter (HOSPITAL_COMMUNITY): Payer: Self-pay | Admitting: *Deleted

## 2014-12-10 ENCOUNTER — Inpatient Hospital Stay (HOSPITAL_COMMUNITY)
Admission: AD | Admit: 2014-12-10 | Discharge: 2014-12-12 | DRG: 774 | Disposition: A | Discharge status: Home / Self Care | Payer: Medicaid Other | Source: Ambulatory Visit | Attending: Obstetrics and Gynecology | Admitting: Obstetrics and Gynecology

## 2014-12-10 DIAGNOSIS — O9832 Other infections with a predominantly sexual mode of transmission complicating childbirth: Secondary | ICD-10-CM | POA: Diagnosis present

## 2014-12-10 DIAGNOSIS — O99284 Endocrine, nutritional and metabolic diseases complicating childbirth: Secondary | ICD-10-CM | POA: Diagnosis present

## 2014-12-10 DIAGNOSIS — O34219 Maternal care for unspecified type scar from previous cesarean delivery: Secondary | ICD-10-CM

## 2014-12-10 DIAGNOSIS — E039 Hypothyroidism, unspecified: Secondary | ICD-10-CM | POA: Diagnosis present

## 2014-12-10 DIAGNOSIS — Z833 Family history of diabetes mellitus: Secondary | ICD-10-CM

## 2014-12-10 DIAGNOSIS — O3421 Maternal care for scar from previous cesarean delivery: Principal | ICD-10-CM | POA: Diagnosis present

## 2014-12-10 DIAGNOSIS — A6000 Herpesviral infection of urogenital system, unspecified: Secondary | ICD-10-CM | POA: Diagnosis present

## 2014-12-10 DIAGNOSIS — IMO0001 Reserved for inherently not codable concepts without codable children: Secondary | ICD-10-CM

## 2014-12-10 DIAGNOSIS — Z3A37 37 weeks gestation of pregnancy: Secondary | ICD-10-CM | POA: Diagnosis present

## 2014-12-10 DIAGNOSIS — O99334 Smoking (tobacco) complicating childbirth: Secondary | ICD-10-CM | POA: Diagnosis present

## 2014-12-10 DIAGNOSIS — F17210 Nicotine dependence, cigarettes, uncomplicated: Secondary | ICD-10-CM | POA: Diagnosis present

## 2014-12-10 LAB — CBC
HCT: 38.1 % (ref 36.0–46.0)
Hemoglobin: 12.7 g/dL (ref 12.0–15.0)
MCH: 27.8 pg (ref 26.0–34.0)
MCHC: 33.3 g/dL (ref 30.0–36.0)
MCV: 83.4 fL (ref 78.0–100.0)
Platelets: 292 10*3/uL (ref 150–400)
RBC: 4.57 MIL/uL (ref 3.87–5.11)
RDW: 14.6 % (ref 11.5–15.5)
WBC: 19.1 10*3/uL — AB (ref 4.0–10.5)

## 2014-12-10 LAB — TYPE AND SCREEN
ABO/RH(D): O POS
ANTIBODY SCREEN: NEGATIVE

## 2014-12-10 MED ORDER — OXYCODONE-ACETAMINOPHEN 5-325 MG PO TABS: 2.0000 | ORAL_TABLET | ORAL | Status: DC | PRN

## 2014-12-10 MED ORDER — CITRIC ACID-SODIUM CITRATE 334-500 MG/5ML PO SOLN: 30.0000 mL | ORAL | Status: DC | PRN

## 2014-12-10 MED ORDER — BENZOCAINE-MENTHOL 20-0.5 % EX AERO
1.0000 "application " | INHALATION_SPRAY | CUTANEOUS | Status: DC | PRN
  Filled 2014-12-10: qty 56

## 2014-12-10 MED ORDER — ACETAMINOPHEN 325 MG PO TABS: 650.0000 mg | ORAL_TABLET | ORAL | Status: DC | PRN

## 2014-12-10 MED ORDER — BUTORPHANOL TARTRATE 1 MG/ML IJ SOLN
1.0000 mg | Freq: Once | INTRAMUSCULAR | Status: AC
  Administered 2014-12-10: 1 mg via INTRAVENOUS

## 2014-12-10 MED ORDER — BUTORPHANOL TARTRATE 1 MG/ML IJ SOLN
INTRAMUSCULAR | Status: AC
  Filled 2014-12-10: qty 1

## 2014-12-10 MED ORDER — IBUPROFEN 600 MG PO TABS
600.0000 mg | ORAL_TABLET | Freq: Four times a day (QID) | ORAL | Status: DC
  Administered 2014-12-10 – 2014-12-12 (×8): 600 mg via ORAL
  Filled 2014-12-10 (×8): qty 1

## 2014-12-10 MED ORDER — LACTATED RINGERS IV SOLN: 500.0000 mL | INTRAVENOUS | Status: DC | PRN

## 2014-12-10 MED ORDER — SERTRALINE HCL 100 MG PO TABS
100.0000 mg | ORAL_TABLET | Freq: Every day | ORAL | Status: DC
  Administered 2014-12-10 – 2014-12-11 (×2): 100 mg via ORAL
  Filled 2014-12-10 (×3): qty 1

## 2014-12-10 MED ORDER — LACTATED RINGERS IV SOLN: INTRAVENOUS | Status: DC

## 2014-12-10 MED ORDER — ZOLPIDEM TARTRATE 5 MG PO TABS: 5.0000 mg | ORAL_TABLET | Freq: Every evening | ORAL | Status: DC | PRN

## 2014-12-10 MED ORDER — ONDANSETRON HCL 4 MG PO TABS: 4.0000 mg | ORAL_TABLET | ORAL | Status: DC | PRN

## 2014-12-10 MED ORDER — DIPHENHYDRAMINE HCL 25 MG PO CAPS: 25.0000 mg | ORAL_CAPSULE | Freq: Four times a day (QID) | ORAL | Status: DC | PRN

## 2014-12-10 MED ORDER — OXYTOCIN BOLUS FROM INFUSION: 500.0000 mL | INTRAVENOUS | Status: DC

## 2014-12-10 MED ORDER — WITCH HAZEL-GLYCERIN EX PADS: 1.0000 "application " | MEDICATED_PAD | CUTANEOUS | Status: DC | PRN

## 2014-12-10 MED ORDER — SENNOSIDES-DOCUSATE SODIUM 8.6-50 MG PO TABS
2.0000 | ORAL_TABLET | ORAL | Status: DC
  Administered 2014-12-11 (×2): 2 via ORAL
  Filled 2014-12-10 (×2): qty 2

## 2014-12-10 MED ORDER — OXYCODONE-ACETAMINOPHEN 5-325 MG PO TABS: 1.0000 | ORAL_TABLET | ORAL | Status: DC | PRN

## 2014-12-10 MED ORDER — OXYCODONE-ACETAMINOPHEN 5-325 MG PO TABS
1.0000 | ORAL_TABLET | ORAL | Status: DC | PRN
  Administered 2014-12-10 – 2014-12-11 (×3): 1 via ORAL
  Filled 2014-12-10 (×3): qty 1

## 2014-12-10 MED ORDER — DIBUCAINE 1 % RE OINT: 1.0000 "application " | TOPICAL_OINTMENT | RECTAL | Status: DC | PRN

## 2014-12-10 MED ORDER — LIDOCAINE HCL (PF) 1 % IJ SOLN
30.0000 mL | INTRAMUSCULAR | Status: DC | PRN
  Filled 2014-12-10: qty 30

## 2014-12-10 MED ORDER — SIMETHICONE 80 MG PO CHEW: 80.0000 mg | CHEWABLE_TABLET | ORAL | Status: DC | PRN

## 2014-12-10 MED ORDER — PRENATAL MULTIVITAMIN CH
1.0000 | ORAL_TABLET | Freq: Every day | ORAL | Status: DC
  Administered 2014-12-11 – 2014-12-12 (×2): 1 via ORAL
  Filled 2014-12-10 (×2): qty 1

## 2014-12-10 MED ORDER — OXYTOCIN 40 UNITS IN LACTATED RINGERS INFUSION - SIMPLE MED
62.5000 mL/h | INTRAVENOUS | Status: DC
  Filled 2014-12-10: qty 1000

## 2014-12-10 MED ORDER — FLEET ENEMA 7-19 GM/118ML RE ENEM: 1.0000 | ENEMA | RECTAL | Status: DC | PRN

## 2014-12-10 MED ORDER — TETANUS-DIPHTH-ACELL PERTUSSIS 5-2.5-18.5 LF-MCG/0.5 IM SUSP: 0.5000 mL | Freq: Once | INTRAMUSCULAR | Status: DC

## 2014-12-10 MED ORDER — ONDANSETRON HCL 4 MG/2ML IJ SOLN: 4.0000 mg | INTRAMUSCULAR | Status: DC | PRN

## 2014-12-10 MED ORDER — LANOLIN HYDROUS EX OINT: TOPICAL_OINTMENT | CUTANEOUS | Status: DC | PRN

## 2014-12-10 MED ORDER — OXYTOCIN 40 UNITS IN LACTATED RINGERS INFUSION - SIMPLE MED
62.5000 mL/h | INTRAVENOUS | Status: DC
  Administered 2014-12-10: 62.5 mL/h via INTRAVENOUS

## 2014-12-10 MED ORDER — ONDANSETRON HCL 4 MG/2ML IJ SOLN: 4.0000 mg | Freq: Four times a day (QID) | INTRAMUSCULAR | Status: DC | PRN

## 2014-12-10 NOTE — H&P (Signed)
Dorothy Brown is a 34 y.o. female [id number]  At 37+ weeks (EDD 01/07/15 by 14 week US)  Presented from office in active labor.  Pt was 4cm in office and progressed to 7cm on arrival to hospital.  Prenatal care complicated by late start at 14 weeks and dated by a 14 week US.  Prior delivery was a c-section for breech and patient had always intended a VBAC if able. Consented for VBAC in office.  She is a current smoker of 1ppd.  No other issues except +HSV history, on suppression.  Hypothyroidism, but stable off meds.  Maternal Medical History:  Reason for admission: Contractions.   Contractions: Onset was 6-12 hours ago.   Frequency: regular.   Perceived severity is strong.    Fetal activity: Perceived fetal activity is normal.    Prenatal Complications - Diabetes: none.    OB History    Gravida Para Term Preterm AB TAB SAB Ectopic Multiple Living   4 1 1  1 1    1     2011 LTCS 37 weeks labor, breech 2013 EAB 2014 SAB  Past Medical History  Diagnosis Date  . Depression   . Anxiety   . Allergy   . Hypothyroidism   . 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 Cesarean section    . Induced abortion    . Wisdom tooth extraction     Family History: family history includes Arthritis in her mother; Cancer in her maternal uncle; Diabetes in her father. Social History:  reports that she has been smoking Cigarettes.  She has been smoking about 1.00 pack per day. She does not have any smokeless tobacco history on file. She reports that she does not drink alcohol or use illicit drugs.   Prenatal Transfer Tool  Maternal Diabetes: No Genetic Screening: Normal Maternal Ultrasounds/Referrals: Normal Fetal Ultrasounds or other Referrals:  None Maternal Substance Abuse:  Yes:  Type: Smoker Significant Maternal Medications:  None Significant Maternal Lab Results:  Lab values include: Group B Strep negative Other Comments:   None  ROS  Dilation: 7 Effacement (%): 90 Station: +1, 0 Exam by:: K.WIlson,RN Last menstrual period 03/25/2014, unknown if currently breastfeeding. Maternal Exam:  Uterine Assessment: Contraction strength is firm.  Contraction frequency is regular.   Abdomen: Surgical scars: low transverse.   Fetal presentation: vertex  Introitus: Normal vulva. Normal vagina.    Physical Exam  Constitutional: She appears well-developed and well-nourished.  Cardiovascular: Normal rate and regular rhythm.   Respiratory: Effort normal and breath sounds normal.  Genitourinary: Vagina normal.  Neurological: She is alert.  Psychiatric: She has a normal mood and affect.    Prenatal labs: ABO, Rh:  Opositive Antibody:  neg Rubella:  Immune RPR:   NR HBsAg:   Neg HIV:   Neg GBS:   Nergative One hour GTT 133 Panorama low risk  Assessment/Plan: Pt arrived in active labor and progressed rapidly to complete dilation and delivered just after arrival to L&D.  See delivery note.   Oliver PilaRICHARDSON,Christne Platts W 12/10/2014, 12:53 PM

## 2014-12-10 NOTE — Progress Notes (Signed)
UR chart review completed.  

## 2014-12-10 NOTE — Progress Notes (Signed)
MOB was referred for history of depression/anxiety.  Referral is screened out by Clinical Social Worker because none of the following criteria appear to apply: -History of anxiety/depression during this pregnancy, or of post-partum depression. - Diagnosis of anxiety and/or depression within last 3 years - History of depression due to pregnancy loss/loss of child or -MOB's symptoms are currently being treated with medication and/or therapy.  CSW completed chart review. Per OB records, MOB diagnosed with depression/anxiety in 2009.  She is currently prescribed Zoloft.  On initial prenatal visit, she reported that she was "doing well" on medication.  No acute symptoms noted during pregnancy.  Please contact the Clinical Social Worker if needs arise or upon MOB request.

## 2014-12-10 NOTE — MAU Note (Signed)
Pt reports having ct x q 4 min. 4 CM in offic this mornong

## 2014-12-10 NOTE — Lactation Note (Addendum)
This note was copied from the chart of Girl Bed Bath & BeyondSonni Darrough. Lactation Consultation Note  Patient Name: Girl Alric SetonSonni Brunetto ZOXWR'UToday's Date: 12/10/2014 Reason for consult: Initial assessment;Infant < 6lbs (early term baby at 37 weeks and 1 day) This is mom's second baby and her older daughter, now 864 yo, breastfed for 5 months.  This first baby was also small and born "early term".  Mom has easily expressible colostrum but baby is too fussy to latch after multiple attempts. Baby latches for 5 minutes with some strong sucking bursts but then falls asleep and slips off.  LC assisted mom to re-latch after partially swaddling baby due to her fussiness.  She needs persistent breast compression and a few drops of ebm by spoon, then finally latches in a sidelying football position and rhythmical sucking bursts with wide jaw excursions and intermittent swallows noted.  LC reviewed needs of "early term" infant according to LPI parent handout.  RN, Jenetta Logesenea has provided DEBP for mom and the plan is for her to pump at least 4-5 times per 24 hours and provide ebm as supplement as indicated based on output, weight and signs of milk transfer.  LC encouraged STS, minimum of q3h feedings and watching closely for baby to remain pink and sustain latch and strong sucking bursts for at least 10-15 minutes.  Baby remained latched >10 additional minutes and both LC and RN observed a brief fading of her pink color which resolved quickly with positioning baby slightly away from breast.  This was pointed out to mom and she is encouraged to watch baby closely during feedings.   Maternal Data Formula Feeding for Exclusion: No (supplement may be indicated if weight loss or feedings indicate) Has patient been taught Hand Expression?: Yes (RN and LC) Does the patient have breastfeeding experience prior to this delivery?: Yes  Feeding Feeding Type: Breast Fed Length of feed: 5 min  LATCH Score/Interventions Latch: Repeated attempts needed to  sustain latch, nipple held in mouth throughout feeding, stimulation needed to elicit sucking reflex. Intervention(s): Assist with latch;Breast compression  Audible Swallowing: A few with stimulation Intervention(s): Skin to skin;Hand expression  Type of Nipple: Everted at rest and after stimulation  Comfort (Breast/Nipple): Soft / non-tender     Hold (Positioning): Assistance needed to correctly position infant at breast and maintain latch. Intervention(s): Breastfeeding basics reviewed;Support Pillows;Position options;Skin to skin  LATCH Score: 7 (initial brief latch, which improved to "9" when re-latched in sidelying football position)  Lactation Tools Discussed/Used Pump Review: Setup, frequency, and cleaning;Milk Storage Initiated by:: RN, Tenea Date initiated:: 12/10/14 STS, q3h feeding and more often, if cuing Early term needs (LPI handout) Partial swaddling and calming techniques Hand expression and spoon feeding  Consult Status Consult Status: Follow-up Date: 12/11/14 Follow-up type: In-patient    Warrick ParisianBryant, Divon Krabill [facility] 12/10/2014, 8:42 PM

## 2014-12-11 LAB — CBC
HEMATOCRIT: 36 % (ref 36.0–46.0)
HEMOGLOBIN: 11.6 g/dL — AB (ref 12.0–15.0)
MCH: 27.6 pg (ref 26.0–34.0)
MCHC: 32.2 g/dL (ref 30.0–36.0)
MCV: 85.7 fL (ref 78.0–100.0)
Platelets: 271 10*3/uL (ref 150–400)
RBC: 4.2 MIL/uL (ref 3.87–5.11)
RDW: 14.8 % (ref 11.5–15.5)
WBC: 14.9 10*3/uL — ABNORMAL HIGH (ref 4.0–10.5)

## 2014-12-11 LAB — HIV ANTIBODY (ROUTINE TESTING W REFLEX): HIV Screen 4th Generation wRfx: NONREACTIVE

## 2014-12-11 LAB — RPR: RPR Ser Ql: NONREACTIVE

## 2014-12-11 NOTE — Lactation Note (Signed)
This note was copied from the chart of Dorothy [employer] BeyondSonni Fazzino. Lactation Consultation Note  Heel stick had been done for PKU when I entered the roomed.  Baby was in her mother's arms, unwrapped and did not wake for heel stick. She has eaten 7 times in the past 12 hours though 3 hours have passed since the last feeding.  I recommended mom try and wake her to feed.  She stated she would.  I asked her to call for a feeding assessment when the baby was attached and she agreed to do this. Patient Name: Dorothy Alric SetonSonni Pacer  WUJWJ'XToday's Date: 12/11/2014     Maternal Data    Feeding Feeding Type:  (reminded mom to wake and feed baby either by breast or spoon) Length of feed: 15 min  LATCH Score/Interventions                      Lactation Tools Discussed/Used     Consult Status      Soyla DryerJoseph, Keimora Swartout 12/11/2014, 4:59 PM

## 2014-12-11 NOTE — Progress Notes (Signed)
PPD #1 No problems Afeb, VSS Fundus firm, NT at U-1 Continue routine postpartum care 

## 2014-12-12 MED ORDER — IBUPROFEN 600 MG PO TABS: 600.0000 mg | ORAL_TABLET | Freq: Four times a day (QID) | ORAL | Status: DC

## 2014-12-12 MED ORDER — OXYCODONE-ACETAMINOPHEN 5-325 MG PO TABS: 1.0000 | ORAL_TABLET | ORAL | Status: DC | PRN

## 2014-12-12 NOTE — Lactation Note (Signed)
This note was copied from the chart of Girl [employer]Sonni Eccleston. Lactation Consultation Note  Patient Name: Girl Alric SetonSonni Chaloux IRJJO'AToday's Date: 12/12/2014   Visited with Mom on day of discharge, baby 7747 hrs old.  Mom has recently started supplementing using formula by bottle as Pediatrician had recommended due to weight loss.  Mom has a double electric pump at home, and [facility] her to pump at each feeding for 15 mins to support her milk supply.  Mom not wanting any help, saying she had been through this with her first child being little.  Recommended she call and make an OP lactation appointment in a week or so, to assess baby's ability to transfer milk.  To call us prn.   Judee ClaraSmith, Mackinzie Vuncannon E 12/12/2014, 12:33 PM

## 2014-12-12 NOTE — Discharge Instructions (Signed)
As per discharge pamphlet °

## 2014-12-12 NOTE — Discharge Summary (Signed)
Obstetric Discharge Summary Reason for Admission: onset of labor and rupture of membranes Prenatal Procedures: none Intrapartum Procedures: spontaneous vaginal delivery and VBAC Postpartum Procedures: none Complications-Operative and Postpartum: none HEMOGLOBIN  Date Value Ref Range Status  12/11/2014 11.6* 12.0 - 15.0 g/dL Final   HCT  Date Value Ref Range Status  12/11/2014 36.0 36.0 - 46.0 % Final    Physical Exam:  General: alert Lochia: appropriate Uterine Fundus: firm   Discharge Diagnoses: Term Pregnancy-delivered and VBAC  Discharge Information: Date: 12/12/2014 Activity: pelvic rest Diet: routine Medications: Ibuprofen and Percocet Condition: stable Instructions: refer to practice specific booklet Discharge to: home Follow-up Information    Follow up with Oliver PilaICHARDSON,KATHY W, MD. Schedule an appointment as soon as possible for a visit in 6 weeks.   Specialty:  Obstetrics and Gynecology   Contact information:   [address] [place] [address] [phone]       Newborn Data: Live born female  Birth Weight: 5 lb 1.8 oz (2320 g) APGAR: 8, 9  Home with mother.  Justinian Miano D 12/12/2014, 10:29 AM

## 2014-12-12 NOTE — Progress Notes (Signed)
PPD #2 Doing well Afeb, VSS D/c home 

## 2015-08-07 ENCOUNTER — Encounter (HOSPITAL_COMMUNITY): Payer: Self-pay | Admitting: Emergency Medicine

## 2015-08-07 ENCOUNTER — Emergency Department (INDEPENDENT_AMBULATORY_CARE_PROVIDER_SITE_OTHER)
Admission: EM | Admit: 2015-08-07 | Discharge: 2015-08-07 | Disposition: A | Discharge status: Home / Self Care | Payer: Self-pay | Source: Home / Self Care | Attending: Family Medicine | Admitting: Family Medicine

## 2015-08-07 DIAGNOSIS — J069 Acute upper respiratory infection, unspecified: Secondary | ICD-10-CM

## 2015-08-07 DIAGNOSIS — J019 Acute sinusitis, unspecified: Secondary | ICD-10-CM

## 2015-08-07 MED ORDER — HYDROCODONE-HOMATROPINE 5-1.5 MG/5ML PO SYRP: 5.0000 mL | ORAL_SOLUTION | Freq: Four times a day (QID) | ORAL | Status: DC | PRN

## 2015-08-07 NOTE — ED Notes (Signed)
AVW:[id number]:runny nose, stuffy nose, cough.  Patient feeling very tired even shortly after getting up in morning

## 2015-08-07 NOTE — Discharge Instructions (Signed)
°  It was nice seeing you. I am sorry you don't feel well. You likely have URI in addition to sinus infection. I will recommend hycodan as needed for cough and congestion. Continue Zyrtec for allergic rhinitis. Follow up with us soon if no improvement. Sinus Rinse WHAT IS A SINUS RINSE? A sinus rinse is a home treatment. It rinses your sinuses with a mixture of salt and water (saline solution). Sinuses are air-filled spaces in your skull behind the bones of your face and forehead. They open into your nasal cavity. To do a sinus rinse, you will need:  Saline solution.  Neti pot or spray bottle. This releases the saline solution into your nose and through your sinuses. You can buy neti pots and spray bottles at:  Your local pharmacy.  A health food store.  Online. WHEN WOULD I DO A SINUS RINSE?  A sinus rinse can help to clear your nasal cavity. It can clear:   Mucus.  Dirt.  Dust.  Pollen. You may do a sinus rinse when you have:  A cold.  A virus.  Allergies.  A sinus infection.  A stuffy nose. If you are considering a sinus rinse:  Ask your child's doctor before doing a sinus rinse on your child.  Do not do a sinus rinse if you have had:  Ear or nasal surgery.  An ear infection.  Blocked ears. HOW DO I DO A SINUS RINSE?   Wash your hands.  Disinfect your device using the directions that came with the device.  Dry your device.  Use the solution that comes with your device or one that is sold separately in stores. Follow the mixing directions on the package.  Fill your device with the amount of saline solution as stated in the device instructions.  Stand over a sink and tilt your head sideways over the sink.  Place the spout of the device in your upper nostril (the one closer to the ceiling).  Gently pour or squeeze the saline solution into the nasal cavity. The liquid should drain to the lower nostril if you are not too congested.  Gently blow your nose.  Blowing too hard may cause ear pain.  Repeat in the other nostril.  Clean and rinse your device with clean water.  Air-dry your device. ARE THERE RISKS OF A SINUS RINSE?  Sinus rinse is normally very safe and helpful. However, there are a few risks, which include:   A burning feeling in the sinuses. This may happen if you do not make the saline solution as instructed. Make sure to follow all directions when making the saline solution.  Infection from unclean water. This is rare, but possible.  Nasal irritation.   This information is not intended to replace advice given to you by your health care provider. Make sure you discuss any questions you have with your health care provider.   Document Released: 04/01/2014 Document Reviewed: 04/01/2014 Elsevier Interactive Patient Education [employer]2016 Elsevier Inc.

## 2015-08-07 NOTE — ED Provider Notes (Signed)
CSN: [id number]     Arrival date & time 08/07/15  1854 History   First MD Initiated Contact with Patient 08/07/15 1922     No chief complaint on file.  (Consider location/radiation/quality/duration/timing/severity/associated sxs/prior Treatment) The history is provided by the patient. No language interpreter was used.  Sinus problem/Cough: C/O cough and sinus congestion associated with facial pain that started 2 days ago. Cough is non-productive. She denies SOB or wheezing. She is congested in her nose, she uses Zyrtec for allergies with no improvement of her symptoms.She also uses mucinex with no improvement. Her throat is itchy. She endorsed headache, chill, no fever. Sick contact with her boss last week.   Past Medical History  Diagnosis Date  . Depression   . Anxiety   . Allergy   . Hypothyroidism   . 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 Cesarean section    . Induced abortion    . Wisdom tooth extraction     Family History  Problem Relation Age of Onset  . Arthritis Mother   . Diabetes Father   . Cancer Maternal Uncle    Social History  Substance Use Topics  . Smoking status: Current Every Day Smoker -- 1.00 packs/day    Types: Cigarettes  . Smokeless tobacco: Not on file  . Alcohol Use: No   OB History    Gravida Para Term Preterm AB TAB SAB Ectopic Multiple Living   0 2     Review of Systems  HENT: Positive for rhinorrhea, sinus pressure and sneezing. Negative for ear discharge, ear pain, facial swelling, sore throat and voice change.   Eyes: Negative.   Respiratory: Negative.   Cardiovascular: Negative.   All other systems reviewed and are negative.   Allergies  Review of patient's allergies indicates no known allergies.  Home Medications   Prior to Admission medications   Medication Sig Start Date End Date Taking? Authorizing Provider  acetaminophen (TYLENOL) 500 MG tablet Take 1,000 mg  by mouth every 6 (six) hours as needed for mild pain or moderate pain.    Historical Provider, MD  diphenhydrAMINE (BENADRYL) 25 mg capsule Take 25 mg by mouth at bedtime as needed for allergies or sleep.    Historical Provider, MD  ibuprofen (ADVIL,MOTRIN) 600 MG tablet Take 1 tablet (600 mg total) by mouth every 6 (six) hours. 12/12/14   Lavina Hamman, MD  oxyCODONE-acetaminophen (PERCOCET/ROXICET) 5-325 MG per tablet Take 1-2 tablets by mouth every 4 (four) hours as needed (for pain scale 4-7). 12/12/14   Lavina Hamman, MD  Prenatal Vit-Fe Fumarate-FA (PRENATAL MULTIVITAMIN) TABS Take 1 tablet by mouth daily at 12 noon.    Historical Provider, MD  sertraline (ZOLOFT) 100 MG tablet TAKE 1 TABLET BY MOUTH DAILY 05/27/12   Sondra Barges, PA-C   Meds Ordered and Administered this Visit  Medications - No data to display  BP 120/79 mmHg  Pulse 97  Temp(Src) 98.5 F (36.9 C) (Oral)  Resp 20  SpO2 98% No data found.   Physical Exam  Constitutional: Vital signs are normal. She appears well-developed.  Non-toxic appearance.  HENT:  Right Ear: Tympanic membrane, external ear and ear canal normal.  Left Ear: Tympanic membrane, external ear and ear canal normal.  Mouth/Throat: Uvula is midline, oropharynx is clear and moist and mucous membranes are normal.  Eyes: Conjunctivae and EOM are normal. Pupils  are equal, round, and reactive to light. Right eye exhibits no discharge.  Cardiovascular: Normal rate, regular rhythm and normal heart sounds.   No murmur heard. Pulmonary/Chest: Effort normal and breath sounds normal. No respiratory distress. She has no wheezes. She has no rhonchi. She has no rales.  Neurological: She is alert.    ED Course  Procedures (including critical care time)  Labs Review Labs Reviewed - No data to display  Imaging Review No results found.   Visual Acuity Review  Right Eye Distance:   Left Eye Distance:   Bilateral Distance:    Right Eye Near:   Left Eye  Near:    Bilateral Near:         MDM  No diagnosis found. URI (upper respiratory infection)  Acute sinusitis, recurrence not specified, unspecified location  Patient reassured her symptoms is likely due to viral infection and she should expect to get better in few days. She may continue Zyrtec. Hycodan prescribed prn cough and congestion. May d/c Mucinex. Return precaution discussed. She verbalized understanding.    Doreene ElandKehinde T Cay Kath, MD 08/07/15 [phone]

## 2015-09-17 ENCOUNTER — Encounter (HOSPITAL_COMMUNITY): Payer: Self-pay | Admitting: *Deleted

## 2015-09-17 ENCOUNTER — Emergency Department (HOSPITAL_COMMUNITY)
Admission: EM | Admit: 2015-09-17 | Discharge: 2015-09-17 | Disposition: A | Discharge status: Home / Self Care | Payer: Self-pay | Source: Home / Self Care | Attending: Family Medicine | Admitting: Family Medicine

## 2015-09-17 DIAGNOSIS — J0101 Acute recurrent maxillary sinusitis: Secondary | ICD-10-CM

## 2015-09-17 MED ORDER — IPRATROPIUM BROMIDE 0.06 % NA SOLN: 2.0000 | Freq: Four times a day (QID) | NASAL | Status: DC

## 2015-09-17 MED ORDER — DOXYCYCLINE HYCLATE 100 MG PO CAPS: 100.0000 mg | ORAL_CAPSULE | Freq: Two times a day (BID) | ORAL | Status: DC

## 2015-09-17 NOTE — Discharge Instructions (Signed)
Take all of medicine, drink lots of fluids, no more smoking, see your doctor if further problems  °

## 2015-09-17 NOTE — ED Notes (Signed)
Pt  Reports  Symptoms  Of  Cough   /  Congested    As   Well  As   sorethroat     And    Body  Aches        pT  REPORTS  SYMPTOMS  FOR OVER  1  MONTH     WAS   SEEN  AT THAT TIME      AT THE  [facility]        AND  WAS  TREATED      GOT  SLIGHYLY  BETTER  THAN THE  SYMPTOMS  CAME  BACK

## 2015-09-17 NOTE — ED Provider Notes (Signed)
CSN: [id number]     Arrival date & time 09/17/15  1346 History   First MD Initiated Contact with Patient 09/17/15 1546     Chief Complaint  Patient presents with  . URI   (Consider location/radiation/quality/duration/timing/severity/associated sxs/prior Treatment) Patient is a 34 y.o. female presenting with URI. The history is provided by the patient.  URI Presenting symptoms: congestion, cough and rhinorrhea   Presenting symptoms: no fever and no sore throat   Severity:  Moderate Duration:  3 weeks Progression:  Unchanged Chronicity:  New Ineffective treatments:  Decongestant Associated symptoms: no wheezing   Risk factors: recent illness     Past Medical History  Diagnosis Date  . Depression   . Anxiety   . Allergy   . Hypothyroidism   . 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 Cesarean section    . Induced abortion    . Wisdom tooth extraction     Family History  Problem Relation Age of Onset  . Arthritis Mother   . Diabetes Father   . Cancer Maternal Uncle    Social History  Substance Use Topics  . Smoking status: Current Every Day Smoker -- 1.00 packs/day    Types: Cigarettes  . Smokeless tobacco: None  . Alcohol Use: No   OB History    Gravida Para Term Preterm AB TAB SAB Ectopic Multiple Living   0 2     Review of Systems  Constitutional: Negative.  Negative for fever.  HENT: Positive for congestion, postnasal drip and rhinorrhea. Negative for sore throat.   Respiratory: Positive for cough. Negative for shortness of breath and wheezing.   All other systems reviewed and are negative.   Allergies  Review of patient's allergies indicates no known allergies.  Home Medications   Prior to Admission medications   Medication Sig Start Date End Date Taking? Authorizing Provider  acetaminophen (TYLENOL) 500 MG tablet Take 1,000 mg by mouth every 6 (six) hours as needed for mild pain or  moderate pain.    Historical Provider, MD  diphenhydrAMINE (BENADRYL) 25 mg capsule Take 25 mg by mouth at bedtime as needed for allergies or sleep.    Historical Provider, MD  HYDROcodone-homatropine (HYCODAN) 5-1.5 MG/5ML syrup Take 5 mLs by mouth every 6 (six) hours as needed for cough. 08/07/15   Doreene Eland, MD  ibuprofen (ADVIL,MOTRIN) 600 MG tablet Take 1 tablet (600 mg total) by mouth every 6 (six) hours. 12/12/14   Lavina Hamman, MD  oxyCODONE-acetaminophen (PERCOCET/ROXICET) 5-325 MG per tablet Take 1-2 tablets by mouth every 4 (four) hours as needed (for pain scale 4-7). 12/12/14   Lavina Hamman, MD  Prenatal Vit-Fe Fumarate-FA (PRENATAL MULTIVITAMIN) TABS Take 1 tablet by mouth daily at 12 noon.    Historical Provider, MD  sertraline (ZOLOFT) 100 MG tablet TAKE 1 TABLET BY MOUTH DAILY 05/27/12   Sondra Barges, PA-C   Meds Ordered and Administered this Visit  Medications - No data to display  BP 113/72 mmHg  Pulse 95  Temp(Src) 98.2 F (36.8 C) (Oral)  Resp 16  SpO2 100%  LMP 09/14/2015 (Exact Date) No data found.   Physical Exam  Constitutional: She is oriented to person, place, and time. She appears well-developed and well-nourished. No distress.  HENT:  Right Ear: External ear normal.  Left Ear: External ear normal.  Mouth/Throat: Oropharynx is clear and moist.  Eyes: Pupils are equal, round, and reactive to light.  Neck: Normal range of motion. Neck supple.  Cardiovascular: Regular rhythm and normal heart sounds.   Pulmonary/Chest: Effort normal and breath sounds normal. She has no wheezes. She has no rales.  Lymphadenopathy:    She has no cervical adenopathy.  Neurological: She is alert and oriented to person, place, and time.  Skin: Skin is warm and dry.  Nursing note and vitals reviewed.   ED Course  Procedures (including critical care time)  Labs Review Labs Reviewed - No data to display  Imaging Review No results found.   Visual Acuity  Review  Right Eye Distance:   Left Eye Distance:   Bilateral Distance:    Right Eye Near:   Left Eye Near:    Bilateral Near:         MDM  No diagnosis found.     Linna HoffJames D Anisa Leanos, MD 09/17/15 [phone]

## 2017-06-15 ENCOUNTER — Ambulatory Visit (INDEPENDENT_AMBULATORY_CARE_PROVIDER_SITE_OTHER): Payer: Self-pay | Admitting: Physician Assistant

## 2017-06-15 ENCOUNTER — Encounter: Payer: Self-pay | Admitting: Physician Assistant

## 2017-06-15 VITALS — BP 124/70 | HR 55 | Temp 99.0°F | Resp 18 | Ht 59.0 in | Wt 166.0 lb

## 2017-06-15 DIAGNOSIS — A6000 Herpesviral infection of urogenital system, unspecified: Secondary | ICD-10-CM | POA: Insufficient documentation

## 2017-06-15 DIAGNOSIS — F329 Major depressive disorder, single episode, unspecified: Secondary | ICD-10-CM

## 2017-06-15 DIAGNOSIS — F17200 Nicotine dependence, unspecified, uncomplicated: Secondary | ICD-10-CM

## 2017-06-15 DIAGNOSIS — F419 Anxiety disorder, unspecified: Secondary | ICD-10-CM

## 2017-06-15 MED ORDER — BUPROPION HCL ER (XL) 150 MG PO TB24: 150.0000 mg | ORAL_TABLET | Freq: Every day | ORAL | 3 refills | Status: DC

## 2017-06-15 MED ORDER — SERTRALINE HCL 100 MG PO TABS: 100.0000 mg | ORAL_TABLET | Freq: Every day | ORAL | 3 refills | Status: AC

## 2017-06-15 NOTE — Patient Instructions (Addendum)
Continue on current Zoloft dose.   Pick a date to quit smoking. Start the Wellbutrin 7 days prior to your quit date. Discontinue smoking on your quit date and continue to take the Wellbutrin.  If your jaw starts to clench again, please discontinue Wellbutrin and call the office to let us know so we can start different anxiolytic and consider Chantix.    IF you received an x-ray today, you will receive an invoice from [facility]. Please contact [facility] at [phone] with questions or concerns regarding your invoice.   IF you received labwork today, you will receive an invoice from [place]. Please contact [facility] at [phone] with questions or concerns regarding your invoice.   Our billing staff will not be able to assist you with questions regarding bills from these companies.  You will be contacted with the lab results as soon as they are available. The fastest way to get your results is to activate your My Chart account. Instructions are located on the last page of this paperwork. If you have not heard from [place] regarding the results in 2 weeks, please contact this office.    Did you know that you begin to benefit from quitting smoking within the first twenty minutes? It's TRUE.  At 20 minutes: -blood pressure decreases -pulse rate drops -body temperature of hands and feet increases  At 8 hours: -carbon monoxide level in blood drops to normal -oxygen level in blood increases to normal  At 24 hours: -the chance of heart attack decreases  At 48 hours: -nerve endings start regrowing -ability to smell and taste is enhanced  2 weeks-3 months: -circulation improves -walking becomes easier -lung function improves  1-9 months: -coughing, sinus congestion, fatigue and shortness of breath decreases  1 year: -excess risk of heart disease is decreased to HALF that of a smoker  5 years: Stroke risk is reduced to that of people who have never smoked  10  years: -risk of lung cancer drops to as little as half that of continuing smokers -risk of cancer of the mouth, throat, esophagus, bladder, kidney and pancreas decreases -risk of ulcer decreases  15 years -risk of heart disease is now similar to that of people who have never smoked -risk of death returns to nearly the level of people who have never smoked

## 2017-06-15 NOTE — Progress Notes (Signed)
Patient ID: Dorothy Brown, female     DOB: [birth year], 36 y.o.    MRN: [id number]  PCP: Porfirio Oar, PA-C  Chief Complaint  Patient presents with  . Depression    Depression scale score 3, Pt states she is here more preventive care and states she has her depression and axiety under control but feels she may need to increase the dosage on meds.  . Anxiety  . Follow-up    Subjective:   This patient is new to this practice, having not been seen here in >3 years, and presents for evaluation of depression and anxiety.  I last saw Atleigh in 12/2011. At that time she was struggling. Her daughter, Rosario Adie, was 93 years old and [name]'s father was not participating in parenting. She was working at a Teacher, early years/pre and generally feeling overwhelmed with her life.  Shortly after that visit, [name]'s father died. She returned to school, has gotten married and has another daughter, now 13.47 years old. She is working as an International aid/development worker at [employer], and feels really good about where she is in life.  Over the past 2 weeks, her anxiety has been worsening and she is concerned that it may get out of hand. Historically, she's been able to manage her depression as long as her anxiety has been controlled. She has not had any recent trouble or event that she identifies and a trigger for worsening symptoms. She has a supportive husband and loves her job. Her home life isn't particularly stressful (beyond being the mom of a 34 year old and 74.12 year old). She has been on sertraline 100 mg for some time now.  In addition, she is interested in smoking cessation.  Remotely took bupropion, for mood, and stopped it due to experiencing "jaw clenching" as a side effect.  Review of Systems Constitutional: Negative for appetite change, chills, diaphoresis and fever.  HENT: Positive for sneezing. Negative for congestion, postnasal drip, rhinorrhea, sinus pain, sinus pressure and tinnitus.   Eyes:  Negative.   Respiratory: Negative for cough and shortness of breath.   Cardiovascular: Negative for chest pain and palpitations.  Gastrointestinal: Negative for abdominal pain, blood in stool, constipation, diarrhea, nausea and vomiting.  Neurological: Negative for dizziness, weakness, light-headedness and headaches.  Psychiatric/Behavioral: Negative for decreased concentration and sleep disturbance. The patient is nervous/anxious.    Depression screen PHQ 2/9 06/15/2017  Decreased Interest 1  Down, Depressed, Hopeless 1  PHQ - 2 Score 2  Altered sleeping 0  Tired, decreased energy 1  Change in appetite 0  Feeling bad or failure about yourself  0  Trouble concentrating 0  Moving slowly or fidgety/restless 0  Suicidal thoughts 0  PHQ-9 Score 3  Difficult doing work/chores Not difficult at all      Prior to Admission medications   Medication Sig Start Date End Date Taking? Authorizing Provider  sertraline (ZOLOFT) 100 MG tablet TAKE 1 TABLET BY MOUTH DAILY 05/27/12  Yes Dunn, Raymon Mutton, PA-C  levonorgestrel (MIRENA, 52 MG,) 20 MCG/24HR IUD Mirena 20 mcg/24 hr (5 years) intrauterine device  Take 1 device every day by intrauterine route.    [provider]     No Known Allergies   Patient Active Problem List   Diagnosis Date Noted  . Genital herpes simplex type 2 06/15/2017  . Anxiety and depression 06/15/2017     Family History  Problem Relation Age of Onset  . Arthritis Mother   .  Diabetes Father   . Cancer Maternal Uncle      Social History   Social History  . Marital status: Married    Spouse name: Jomarie Longs  . Number of children: 2  . Years of education: Few years of college   Occupational History  . Garment/textile technologist [employer]   Social History Main Topics  . Smoking status: Current Every Day Smoker    Packs/day: 1.00    Types: Cigarettes  . Smokeless tobacco: Never Used  . Alcohol use No  . Drug use: No  . Sexual  activity: Yes    Partners: Male   Other Topics Concern  . Not on file   Social History Narrative   Live with husband and two daughters (born 71-[name] and 2016-Jacquelyn).   [name]'s father died in 2012/01/31.         Objective:  Physical Exam  Constitutional: She is oriented to person, place, and time. She appears well-developed and well-nourished. She is active and cooperative. No distress.  BP 124/70 (BP Location: Right Arm, Patient Position: Sitting, Cuff Size: Normal)   Pulse (!) 55   Temp 99 F (37.2 C) (Oral)   Resp 18   Ht  (1.499 m)   Wt 166 lb (75.3 kg)   SpO2 98%   BMI 33.53 kg/m   HENT:  Head: Normocephalic and atraumatic.  Right Ear: Hearing normal.  Left Ear: Hearing normal.  Eyes: Conjunctivae are normal. No scleral icterus.  Neck: Normal range of motion. Neck supple. No thyromegaly present.  Cardiovascular: Normal rate, regular rhythm and normal heart sounds.   Pulses:      Radial pulses are 2+ on the right side, and 2+ on the left side.  Pulmonary/Chest: Effort normal and breath sounds normal.  Lymphadenopathy:       Head (right side): No tonsillar, no preauricular, no posterior auricular and no occipital adenopathy present.       Head (left side): No tonsillar, no preauricular, no posterior auricular and no occipital adenopathy present.    She has no cervical adenopathy.       Right: No supraclavicular adenopathy present.       Left: No supraclavicular adenopathy present.  Neurological: She is alert and oriented to person, place, and time. No sensory deficit.  Skin: Skin is warm, dry and intact. No rash noted. No cyanosis or erythema. Nails show no clubbing.  Psychiatric: She has a normal mood and affect. Her speech is normal and behavior is normal.        Assessment & Plan:   Problem List Items Addressed This Visit    Anxiety and depression - Primary    Discussed treatment options. Continue sertraline 100 mg. Retry bupropion. If she has  recurrent jaw clenching, would stop it and try buspirone.      Relevant Medications   buPROPion (WELLBUTRIN XL) 150 MG 24 hr tablet   sertraline (ZOLOFT) 100 MG tablet   Smoker    Ready to quit smoking. Counseled on different options. Since she's adding bupropion, we'll wait to see if that is helpful before considering other medical treatments. Nicotine replacement is an option now, if she chooses.          Return in about 6 weeks (around 07/27/2017) for re-evaluation of mood.   Fernande Bras, PA-C [facility]

## 2017-06-15 NOTE — Progress Notes (Signed)
Subjective:    Patient ID: Dorothy Brown, female    DOB: [birth year], 36 y.o.   MRN: [id number]  HPI Chief Complaint  Patient presents with  . Depression    Depression scale score 3, Pt states she is here more preventive care and states she has her depression and axiety under control but feels she may need to increase the dosage on meds.  . Anxiety  . Follow-up   Patient returns today to re-establish care, last seen in office in 2013. She states that over the past two weeks her anxiety has been progressively increasing and feels that her current Zoloft  is not managing it as well as it had in the past. She denies any panic attack recently. She reports her anxiety triggers her depression. She denies any precipitating event or trigger. She describes a loving and supportive husband. Her home life is not particularly stressful. Patient states her energy level has increased this past week due to taking B12. She drinks about 2 cups of coffee in the morning and maybe 2 sodas a week.  Reports daily BM.  Patient also is interested in smoking cessation. She states she has tried Wellbutrin in the past but did not tolerate it well due to "jaw clenching".   Review of Systems  Constitutional: Negative for appetite change, chills, diaphoresis and fever.  HENT: Positive for sneezing. Negative for congestion, postnasal drip, rhinorrhea, sinus pain, sinus pressure and tinnitus.   Eyes: Negative.   Respiratory: Negative for cough and shortness of breath.   Cardiovascular: Negative for chest pain and palpitations.  Gastrointestinal: Negative for abdominal pain, blood in stool, constipation, diarrhea, nausea and vomiting.  Neurological: Negative for dizziness, weakness, light-headedness and headaches.  Psychiatric/Behavioral: Negative for decreased concentration and sleep disturbance. The patient is nervous/anxious.    Patient Active Problem List   Diagnosis Date Noted  . Genital herpes simplex type 2  06/15/2017  . Anxiety and depression 06/15/2017  . Smoker 06/15/2017   Prior to Admission medications   Medication Sig Start Date End Date Taking? Authorizing Provider  sertraline (ZOLOFT) 100 MG tablet TAKE 1 TABLET BY MOUTH DAILY 05/27/12  Yes Dunn, Raymon Mutton, PA-C  levonorgestrel (MIRENA, 52 MG,) 20 MCG/24HR IUD Mirena 20 mcg/24 hr (5 years) intrauterine device  Take 1 device every day by intrauterine route.    [provider]   No Known Allergies  Social History   Social History  . Marital status: Married    Spouse name: Jomarie Longs  . Number of children: 2  . Years of education: Few years of college   Occupational History  . Sports coach    Social History Main Topics  . Smoking status: Current Every Day Smoker    Packs/day: 1.00    Types: Cigarettes  . Smokeless tobacco: Never Used  . Alcohol use No  . Drug use: No  . Sexual activity: Yes   Other Topics Concern  . Not on file   Social History Narrative   Live with husband and two daughters (born 49-[name] and 2016-Jacquelyn)      Objective:   Physical Exam  Constitutional: She is oriented to person, place, and time. She appears well-developed and well-nourished. No distress.  HENT:  Head: Normocephalic and atraumatic.  Right Ear: External ear normal.  Left Ear: External ear normal.  Neck: Neck supple. No JVD present. No tracheal deviation present. No thyromegaly present.  Cardiovascular: Normal rate, regular rhythm, normal heart sounds and intact  distal pulses.  Exam reveals no gallop and no friction rub.   No murmur heard. Pulmonary/Chest: Effort normal and breath sounds normal. No stridor. No respiratory distress. She has no wheezes. She has no rales. She exhibits no tenderness.  Lymphadenopathy:    She has no cervical adenopathy.  Neurological: She is alert and oriented to person, place, and time. She has normal reflexes.  Skin: Skin is warm and dry. No rash noted. She is not  diaphoretic. No erythema. No pallor.      Assessment & Plan:  1. Anxiety and depression - buPROPion (WELLBUTRIN XL) 150 MG 24 hr tablet; Take 1 tablet (150 mg total) by mouth daily.  Dispense: 90 tablet; Refill: 3 - sertraline (ZOLOFT) 100 MG tablet; Take 1 tablet (100 mg total) by mouth daily.  Dispense: 90 tablet; Refill: 3  2. Smoker - Start on Wellbutrin as aid for smoking cessation along with anxiety. - Instructed patient to call the office if jaw clenching returns with Wellbutrin.   Respectfully, Gala Romney PA-S 2019

## 2017-06-17 ENCOUNTER — Encounter: Payer: Self-pay | Admitting: Physician Assistant

## 2017-06-17 NOTE — Assessment & Plan Note (Signed)
Discussed treatment options. Continue sertraline 100 mg. Retry bupropion. If she has recurrent jaw clenching, would stop it and try buspirone.

## 2017-06-17 NOTE — Assessment & Plan Note (Signed)
Ready to quit smoking. Counseled on different options. Since she's adding bupropion, we'll wait to see if that is helpful before considering other medical treatments. Nicotine replacement is an option now, if she chooses.

## 2017-08-14 ENCOUNTER — Telehealth: Payer: Self-pay | Admitting: Physician Assistant

## 2017-08-14 MED ORDER — VARENICLINE TARTRATE 1 MG PO TABS: 1.0000 mg | ORAL_TABLET | Freq: Two times a day (BID) | ORAL | 1 refills | Status: AC

## 2017-08-14 NOTE — Telephone Encounter (Signed)
Copied from CRM [id number]. Topic: Quick Communication - See Telephone Encounter >> Aug 14, 2017  8:21 AM Louie BunPalacios Medina, Rosey Batheresa D wrote: CRM for notification. See Telephone encounter for: 08/14/17. Patient called and said that the medication buPROPion (WELLBUTRIN XL) 150 MG 24 hr tablet did not work for her. She had reaction to it. Some of the symptoms were bad taste on mouth, sensitive tongue, yaw tight up. She stopped taking it a week ago. She would like chantix instead, and she is taking 150 mg sertraline (ZOLOFT) 100 MG tablet. She needs another 30 day supply so she would not run out. [facility] pharmacy is [facility] [id number] - [place], [place] - [address] [facility] [address]. Please call patient back, thanks.

## 2017-08-14 NOTE — Telephone Encounter (Signed)
Patient notified via My Chart.  Meds ordered this encounter  Medications  . varenicline (CHANTIX) 1 MG tablet    Sig: Take 1 tablet (1 mg total) by mouth 2 (two) times daily.    Dispense:  180 tablet    Refill:  1    Order Specific Question:   Supervising Provider    Answer:   Clelia CroftSHAW, EVA N [[id number]]

## 2017-08-14 NOTE — Telephone Encounter (Signed)
Please advise 

## 2017-08-14 NOTE — Telephone Encounter (Signed)
Request for medication change 

## 2017-09-03 ENCOUNTER — Ambulatory Visit (HOSPITAL_COMMUNITY)
Admission: EM | Admit: 2017-09-03 | Discharge: 2017-09-03 | Disposition: A | Discharge status: Home / Self Care | Payer: BLUE CROSS/BLUE SHIELD | Attending: Family Medicine | Admitting: Family Medicine

## 2017-09-03 ENCOUNTER — Encounter (HOSPITAL_COMMUNITY): Payer: Self-pay | Admitting: *Deleted

## 2017-09-03 ENCOUNTER — Other Ambulatory Visit: Payer: Self-pay

## 2017-09-03 DIAGNOSIS — J069 Acute upper respiratory infection, unspecified: Secondary | ICD-10-CM | POA: Diagnosis not present

## 2017-09-03 DIAGNOSIS — B9789 Other viral agents as the cause of diseases classified elsewhere: Secondary | ICD-10-CM

## 2017-09-03 MED ORDER — HYDROCODONE-HOMATROPINE 5-1.5 MG/5ML PO SYRP: 5.0000 mL | ORAL_SOLUTION | Freq: Four times a day (QID) | ORAL | 0 refills | Status: AC | PRN

## 2017-09-03 NOTE — ED Triage Notes (Addendum)
Per pt sneezing, nasal drainage, dry cough that's making back and chest and back hurts, per pt this started Friday, per pt she's been having body aches and pressure in her head,

## 2017-09-03 NOTE — Discharge Instructions (Signed)
Be aware, your cough medication may cause drowsiness. Please do not drive, operate heavy machinery or make important decisions while on this medication, it can cloud your judgement.  

## 2017-09-05 NOTE — ED Provider Notes (Signed)
  [facility]   [id number] 09/03/17 Arrival Time: 1140  ASSESSMENT & PLAN:  1. Viral URI with cough     Meds ordered this encounter  Medications  . HYDROcodone-homatropine (HYCODAN) 5-1.5 MG/5ML syrup    Sig: Take 5 mLs by mouth every 6 (six) hours as needed for cough.    Dispense:  60 mL    Refill:  0   [place] Controlled Substances Registry consulted for this patient. I feel the risk/benefit ratio today is favorable for proceeding with this prescription for a controlled substance. Medication sedation precautions given.  OTC symptom care as needed. May f/u as needed.  Reviewed expectations re: course of current medical issues. Questions answered. Outlined signs and symptoms indicating need for more acute intervention. Patient verbalized understanding. After Visit Summary given.   SUBJECTIVE: History from: patient.  Dorothy Brown is a 36 y.o. female who presents with complaint of body aches, nasal congestion, post-nasal drainage, and a persistent cough. Onset abrupt, approximately 4 days ago. Overall fatigued. SOB: none. Wheezing: none. Fever: no. Overall normal PO intake without n/v. Sick contacts: no. OTC treatment: cough medication without relief; cough affecting sleep. Social History   Tobacco Use  Smoking Status Former Smoker  . Packs/day: 1.00  . Types: Cigarettes  Smokeless Tobacco Never Used    ROS: As per HPI.   OBJECTIVE:  Vitals:   09/03/17 1239  BP: 119/72  Pulse: 91  Temp: 98.5 F (36.9 C)  SpO2: 100%     General appearance: alert; no distress HEENT: nasal congestion; clear runny nose; throat irritation secondary to post-nasal drainage Neck: supple without LAD Lungs: clear to auscultation bilaterally; active dry cough present Skin: warm and dry Psychological: alert and cooperative; normal mood and affect   No Known Allergies  Past Medical History:  Diagnosis Date  . Allergy   . Anxiety   . Depression   . HSV-2 infection    by  IgG serology  . Hypothyroidism   . VBAC (vaginal birth after Cesarean) 12/10/2014   Family History  Problem Relation Age of Onset  . Arthritis Mother   . Diabetes Father   . Cancer Maternal Uncle    Social History   Socioeconomic History  . Marital status: Married    Spouse name: Jomarie LongsJoseph  . Number of children: 2  . Years of education: Few years of college  . Highest education level: Not on file  Social Needs  . Financial resource strain: Not on file  . Food insecurity - worry: Not on file  . Food insecurity - inability: Not on file  . Transportation needs - medical: Not on file  . Transportation needs - non-medical: Not on file  Occupational History  . Occupation: IT trainerAssistant general manager     Employer: [employer]  Tobacco Use  . Smoking status: Former Smoker    Packs/day: 1.00    Types: Cigarettes  . Smokeless tobacco: Never Used  Substance and Sexual Activity  . Alcohol use: No  . Drug use: No  . Sexual activity: Yes    Partners: Male    Birth control/protection: IUD  Other Topics Concern  . Not on file  Social History Narrative   Live with husband and two daughters (born [birth year]-[name] and 2016-Jacquelyn).   [name]'s father died in 2013.           Mardella LaymanHagler, Kalasia Crafton, MD 09/05/17 [phone]

## 2017-12-20 ENCOUNTER — Encounter: Payer: Self-pay | Admitting: Physician Assistant

## 2020-01-01 ENCOUNTER — Ambulatory Visit: Payer: Self-pay | Attending: Internal Medicine

## 2020-01-01 DIAGNOSIS — Z23 Encounter for immunization: Secondary | ICD-10-CM

## 2020-01-01 NOTE — Progress Notes (Signed)
   Covid-19 Vaccination Clinic  Name:  Dorothy Brown    MRN: [id number] DOB: 1981/04/20  01/01/2020  [name] was observed post Covid-19 immunization for 15 minutes without incident. She was provided with Vaccine Information Sheet and instruction to access the V-Safe system.   [name] was instructed to call 911 with any severe reactions post vaccine: Marland Kitchen Difficulty breathing  . Swelling of face and throat  . A fast heartbeat  . A bad rash all over body  . Dizziness and weakness   Immunizations Administered    Name Date Dose VIS Date Route   Pfizer COVID-19 Vaccine 01/01/2020 12:35 PM 0.3 mL 08/29/2019 Intramuscular   Manufacturer: [employer], [employer]   Lot: [id number]   NDC: [address]-4

## 2020-01-26 ENCOUNTER — Ambulatory Visit: Payer: Self-pay | Attending: Internal Medicine

## 2020-01-26 DIAGNOSIS — Z23 Encounter for immunization: Secondary | ICD-10-CM

## 2020-01-26 NOTE — Progress Notes (Signed)
   Covid-19 Vaccination Clinic  Name:  Dorothy Brown    MRN: [id number] DOB: 12-Jan-1981  01/26/2020  [name] was observed post Covid-19 immunization for 15 minutes without incident. She was provided with Vaccine Information Sheet and instruction to access the V-Safe system.   [name] was instructed to call 911 with any severe reactions post vaccine: Marland Kitchen Difficulty breathing  . Swelling of face and throat  . A fast heartbeat  . A bad rash all over body  . Dizziness and weakness   Immunizations Administered    Name Date Dose VIS Date Route   Pfizer COVID-19 Vaccine 01/26/2020 12:29 PM 0.3 mL 11/12/2018 Intramuscular   Manufacturer: [employer], [employer]   Lot: [id number]   NDC: [address]-8

## 2021-05-30 ENCOUNTER — Other Ambulatory Visit: Payer: Self-pay

## 2021-05-30 ENCOUNTER — Encounter (HOSPITAL_COMMUNITY): Payer: Self-pay

## 2021-05-30 ENCOUNTER — Ambulatory Visit (HOSPITAL_COMMUNITY)
Admission: EM | Admit: 2021-05-30 | Discharge: 2021-05-30 | Disposition: A | Discharge status: Home / Self Care | Payer: Self-pay | Attending: Family Medicine | Admitting: Family Medicine

## 2021-05-30 DIAGNOSIS — R0981 Nasal congestion: Secondary | ICD-10-CM

## 2021-05-30 DIAGNOSIS — J069 Acute upper respiratory infection, unspecified: Secondary | ICD-10-CM

## 2021-05-30 MED ORDER — PREDNISONE 20 MG PO TABS: 40.0000 mg | ORAL_TABLET | Freq: Every day | ORAL | 0 refills | Status: DC

## 2021-05-30 NOTE — ED Triage Notes (Signed)
Pt presents with sinus pressure, productive cough, and congestion X 4 days.

## 2021-05-30 NOTE — ED Provider Notes (Signed)
[facility]   [id number] 05/30/21 Arrival Time: 9371  ASSESSMENT & PLAN:  1. Viral URI with cough   2. Nasal congestion    Discussed typical duration of viral illnesses. COVID-19 testing declined. OTC symptom care as needed.  Trial of: Meds ordered this encounter  Medications   predniSONE (DELTASONE) 20 MG tablet    Sig: Take 2 tablets (40 mg total) by mouth daily.    Dispense:  10 tablet    Refill:  0     Follow-up Information     Porfirio Oar, PA.   Specialty: Family Medicine Why: If worsening or failing to improve as anticipated. Contact information: [address] [place] [address] [phone]                 Reviewed expectations re: course of current medical issues. Questions answered. Outlined signs and symptoms indicating need for more acute intervention. Understanding verbalized. After Visit Summary given.   SUBJECTIVE: History from: patient. Dorothy Brown is a 40 y.o. female who reports nasal congestion, prod cough, fatigue; abrupt onset 3-4 d ago. Denies: fever and difficulty breathing. Normal PO intake without n/v/d. Sudafed without much help.  OBJECTIVE:  Vitals:   05/30/21 0917  BP: 119/86  Pulse: 95  Resp: 17  Temp: 98.6 F (37 C)  TempSrc: Oral  SpO2: 98%    General appearance: alert; no distress Eyes: PERRLA; EOMI; conjunctiva normal HENT: [place]; AT; with nasal congestion; throat irritation Neck: supple  Lungs: speaks full sentences without difficulty; unlabored; CTAB Extremities: no edema Skin: warm and dry Neurologic: normal gait Psychological: alert and cooperative; normal mood and affect    No Known Allergies  Past Medical History:  Diagnosis Date   Allergy    Anxiety    Depression    HSV-2 infection    by IgG serology   Hypothyroidism    VBAC (vaginal birth after Cesarean) 12/10/2014   Social History   Socioeconomic History   Marital status: Married    Spouse name: Jomarie Longs    Number of children: 2   Years of education: Few years of college   Highest education level: Not on file  Occupational History   Occupation: IT trainer: [employer]  Tobacco Use   Smoking status: Former    Packs/day: 1.00    Types: Cigarettes   Smokeless tobacco: Never  Vaping Use   Vaping Use: Never used  Substance and Sexual Activity   Alcohol use: No   Drug use: No   Sexual activity: Yes    Partners: Male    Birth control/protection: I.U.D.  Other Topics Concern   Not on file  Social History Narrative   Live with husband and two daughters (born 31-[name] and 2016-Jacquelyn).   [name]'s father died in Jan 17, 2012.   Social Determinants of Health   Financial Resource Strain: Not on file  Food Insecurity: Not on file  Transportation Needs: Not on file  Physical Activity: Not on file  Stress: Not on file  Social Connections: Not on file  Intimate Partner Violence: Not on file   Family History  Problem Relation Age of Onset   Arthritis Mother    Diabetes Father    Cancer Maternal Uncle    Past Surgical History:  Procedure Laterality Date   CESAREAN SECTION     INDUCED ABORTION     WISDOM TOOTH EXTRACTION       Mardella Layman, MD 05/30/21 [phone]

## 2023-06-18 ENCOUNTER — Ambulatory Visit
Admission: RE | Admit: 2023-06-18 | Discharge: 2023-06-18 | Disposition: A | Discharge status: Home / Self Care | Payer: BC Managed Care – PPO | Source: Ambulatory Visit

## 2023-06-18 ENCOUNTER — Other Ambulatory Visit: Payer: Self-pay

## 2023-06-18 VITALS — BP 106/73 | HR 89 | Temp 98.1°F | Resp 16

## 2023-06-18 DIAGNOSIS — J069 Acute upper respiratory infection, unspecified: Secondary | ICD-10-CM

## 2023-06-18 MED ORDER — BENZONATATE 100 MG PO CAPS: 100.0000 mg | ORAL_CAPSULE | Freq: Three times a day (TID) | ORAL | 0 refills | Status: AC | PRN

## 2023-06-18 MED ORDER — PREDNISONE 20 MG PO TABS: 40.0000 mg | ORAL_TABLET | Freq: Every day | ORAL | 0 refills | Status: AC

## 2023-06-18 NOTE — ED Provider Notes (Signed)
[name]    CSN: [id number] Arrival date & time: 06/18/23  1215      History   Chief Complaint Chief Complaint  Patient presents with   Cough    HPI Dorothy Brown is a 42 y.o. female.   Patient presents with cough, sore throat, nasal congestion that started about 4 days ago.  Patient states that she has chest discomfort when she coughs.  Denies any fever or known sick contacts.  Reports that she did have body aches and chills.  Denies history of asthma or COPD.  Patient has taken over-the-counter Advil for symptoms.   Cough   Past Medical History:  Diagnosis Date   Allergy    Anxiety    Depression    HSV-2 infection    by IgG serology   Hypothyroidism    VBAC (vaginal birth after Cesarean) 12/10/2014    Patient Active Problem List   Diagnosis Date Noted   Genital herpes simplex type 2 06/15/2017   Anxiety and depression 06/15/2017   Smoker 06/15/2017    Past Surgical History:  Procedure Laterality Date   CESAREAN SECTION     INDUCED ABORTION     WISDOM TOOTH EXTRACTION      OB History     Gravida  4   Para  2   Term  2   Preterm      AB  1   Living  2      SAB      IAB  1   Ectopic      Multiple  0   Live Births  2            Home Medications    Prior to Admission medications   Medication Sig Start Date End Date Taking? Authorizing Provider  ARIPiprazole (ABILIFY) 2 MG tablet Take 1 tablet by mouth daily. 02/02/22  Yes [provider]  benzonatate (TESSALON) 100 MG capsule Take 1 capsule (100 mg total) by mouth every 8 (eight) hours as needed for cough. 06/18/23  Yes Purcell Jungbluth, Rolly Salter E, FNP  predniSONE (DELTASONE) 20 MG tablet Take 2 tablets (40 mg total) by mouth daily for 5 days. 06/18/23 06/23/23 Yes Jamin Humphries, Acie Fredrickson, FNP  sertraline (ZOLOFT) 100 MG tablet Take 1 tablet (100 mg total) by mouth daily. Patient taking differently: Take 150 mg by mouth daily. 06/15/17  Yes Jeffery, Chelle, PA   HYDROcodone-homatropine (HYCODAN) 5-1.5 MG/5ML syrup Take 5 mLs by mouth every 6 (six) hours as needed for cough. 09/03/17   Mardella Layman, MD  levonorgestrel (MIRENA, 52 MG,) 20 MCG/24HR IUD Mirena 20 mcg/24 hr (5 years) intrauterine device  Take 1 device every day by intrauterine route.    [provider]  varenicline (CHANTIX) 1 MG tablet Take 1 tablet (1 mg total) by mouth 2 (two) times daily. 08/14/17   Porfirio Oar, PA    Family History Family History  Problem Relation Age of Onset   Arthritis Mother    Diabetes Father    Cancer Maternal Uncle     Social History Social History   Tobacco Use   Smoking status: Former    Current packs/day: 1.00    Types: Cigarettes   Smokeless tobacco: Never  Vaping Use   Vaping status: Every Day  Substance Use Topics   Alcohol use: No   Drug use: No     Allergies   Patient has no known allergies.   Review of Systems Review of Systems Per HPI  Physical Exam Triage Vital Signs ED Triage Vitals  Encounter Vitals Group     BP 06/18/23 1256 106/73     Systolic BP Percentile --      Diastolic BP Percentile --      Pulse Rate 06/18/23 1256 89     Resp 06/18/23 1256 16     Temp 06/18/23 1256 98.1 F (36.7 C)     Temp Source 06/18/23 1256 Oral     SpO2 06/18/23 1256 97 %     Weight --      Height --      Head Circumference --      Peak Flow --      Pain Score 06/18/23 1255 5     Pain Loc --      Pain Education --      Exclude from Growth Chart --    No data found.  Updated Vital Signs BP 106/73   Pulse 89   Temp 98.1 F (36.7 C) (Oral)   Resp 16   SpO2 97%   Visual Acuity Right Eye Distance:   Left Eye Distance:   Bilateral Distance:    Right Eye Near:   Left Eye Near:    Bilateral Near:     Physical Exam Constitutional:      General: She is not in acute distress.    Appearance: Normal appearance. She is not toxic-appearing or diaphoretic.  HENT:     Head: Normocephalic and atraumatic.      Right Ear: Tympanic membrane and ear canal normal.     Left Ear: Tympanic membrane and ear canal normal.     Nose: Congestion present.     Mouth/Throat:     Mouth: Mucous membranes are moist.     Pharynx: No posterior oropharyngeal erythema.  Eyes:     Extraocular Movements: Extraocular movements intact.     Conjunctiva/sclera: Conjunctivae normal.     Pupils: Pupils are equal, round, and reactive to light.  Cardiovascular:     Rate and Rhythm: Normal rate and regular rhythm.     Pulses: Normal pulses.     Heart sounds: Normal heart sounds.  Pulmonary:     Effort: Pulmonary effort is normal. No respiratory distress.     Breath sounds: Normal breath sounds. No stridor. No wheezing, rhonchi or rales.  Abdominal:     General: Abdomen is flat. Bowel sounds are normal.     Palpations: Abdomen is soft.  Musculoskeletal:        General: Normal range of motion.     Cervical back: Normal range of motion.  Skin:    General: Skin is warm and dry.  Neurological:     General: No focal deficit present.     Mental Status: She is alert and oriented to person, place, and time. Mental status is at baseline.  Psychiatric:        Mood and Affect: Mood normal.        Behavior: Behavior normal.      UC Treatments / Results  Labs (all labs ordered are listed, but only abnormal results are displayed) Labs Reviewed - No data to display  EKG   Radiology No results found.  Procedures Procedures (including critical care time)  Medications Ordered in UC Medications - No data to display  Initial Impression / Assessment and Plan / UC Course  I have reviewed the triage vital signs and the nursing notes.  Pertinent labs & imaging results that were available during my  care of the patient were reviewed by me and considered in my medical decision making (see chart for details).     Patient presents with symptoms likely from a viral upper respiratory infection. Do not suspect underlying  cardiopulmonary process. Symptoms seem unlikely related to ACS, CHF or COPD exacerbations, pneumonia, pneumothorax. Patient is nontoxic appearing and not in need of emergent medical intervention.  Suspect chest discomfort with coughing is muscular versus acute viral bronchitis.  There are no adventitious lung sounds on exam so do not think that chest imaging is necessary. Do not suspect any cardiac etiology.  Patient declined COVID testing.  Recommended symptom control with medications and supportive care.  Patient was sent prednisone to decrease inflammation in chest and help alleviate harsh coughing.  Benzonatate prescribed to take as needed for coughing.  Discussed strict return precautions. Patient states understanding and is agreeable.  Discharged with PCP followup.  Final Clinical Impressions(s) / UC Diagnoses   Final diagnoses:  Viral upper respiratory tract infection with cough     Discharge Instructions      Suspect that you have a viral illness that should run its course as we discussed.  I have sent you a cough medication and prednisone to help alleviate symptoms.  Please follow-up if any symptoms persist or worsen.    ED Prescriptions     Medication Sig Dispense Auth. Provider   predniSONE (DELTASONE) 20 MG tablet Take 2 tablets (40 mg total) by mouth daily for 5 days. 10 tablet [place], My[place] E, [place]   benzonatate (TESSALON) 100 MG capsule Take 1 capsule (100 mg total) by mouth every 8 (eight) hours as needed for cough. 21 capsule [place], Acie Fredrickson, [place]      PDMP not reviewed this encounter.   Gustavus Bryant, [place] 06/18/23 1322

## 2023-06-18 NOTE — ED Triage Notes (Signed)
Cough, sore throat, congestion, chest "hurts when I cough" since Friday

## 2023-06-18 NOTE — Discharge Instructions (Signed)
Suspect that you have a viral illness that should run its course as we discussed.  I have sent you a cough medication and prednisone to help alleviate symptoms.  Please follow-up if any symptoms persist or worsen.

## 2024-09-08 ENCOUNTER — Other Ambulatory Visit: Payer: Self-pay

## 2024-09-08 ENCOUNTER — Ambulatory Visit: Admission: RE | Admit: 2024-09-08 | Discharge: 2024-09-08 | Disposition: A | Discharge status: Home / Self Care

## 2024-09-08 VITALS — BP 135/83 | HR 92 | Temp 98.8°F | Resp 15 | Wt 180.0 lb

## 2024-09-08 DIAGNOSIS — R350 Frequency of micturition: Secondary | ICD-10-CM | POA: Diagnosis not present

## 2024-09-08 DIAGNOSIS — N938 Other specified abnormal uterine and vaginal bleeding: Secondary | ICD-10-CM | POA: Diagnosis not present

## 2024-09-08 DIAGNOSIS — N3000 Acute cystitis without hematuria: Secondary | ICD-10-CM | POA: Insufficient documentation

## 2024-09-08 DIAGNOSIS — R300 Dysuria: Secondary | ICD-10-CM | POA: Diagnosis not present

## 2024-09-08 DIAGNOSIS — Z920 Personal history of contraception: Secondary | ICD-10-CM | POA: Insufficient documentation

## 2024-09-08 LAB — POCT URINE DIPSTICK
Bilirubin, UA: NEGATIVE
Glucose, UA: NEGATIVE mg/dL
Ketones, POC UA: NEGATIVE mg/dL
Nitrite, UA: NEGATIVE
POC PROTEIN,UA: NEGATIVE
Spec Grav, UA: <=1.005 — AB
Urobilinogen, UA: 0.2 U/dL
pH, UA: 5.5

## 2024-09-08 MED ORDER — SULFAMETHOXAZOLE-TRIMETHOPRIM 800-160 MG PO TABS: 1.0000 | ORAL_TABLET | Freq: Two times a day (BID) | ORAL | 0 refills | Status: AC

## 2024-09-08 NOTE — Discharge Instructions (Addendum)
 Take the Bactrim  twice daily with food for the next 3 days You can also take over-the-counter Azo to help numb the urinary tract, this may change the color of your urine Ensure you are drinking at least 64 ounces of water daily to help flush the kidneys  We will contact you if we need to change your antibiotics based on your urine culture.  Seek follow-up care if no improvement despite antibiotics or if you have any changes

## 2024-09-08 NOTE — ED Provider Notes (Signed)
 [facility]    CSN: [id number] Arrival date & time: 09/08/24  1750      History   Chief Complaint Chief Complaint  Patient presents with   Urinary Frequency   Dysuria    HPI Dorothy Brown is a 43 y.o. female.   Patient presents to clinic over concern of dysuria, urgency and frequency that started yesterday.  She noticed symptoms worsening into today so she took a home UTI test and noticed that it was positive for nitrites and leukocytes.  Has not had any nausea, vomiting or flank pain.  Does have suprapubic discomfort and pressure with urination.  Has not noticed blood in the urine.  She is spotting today, has an IUD.  The history is provided by the patient and medical records.  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 Authorizing Provider  sulfamethoxazole -trimethoprim  (BACTRIM  DS) 800-160 MG tablet Take 1 tablet by mouth 2 (two) times daily for 3 days. 09/08/24 09/11/24 Yes Sonia Stickels  N, FNP  ARIPiprazole (ABILIFY) 2 MG tablet Take 1 tablet by mouth daily. 02/02/22   [provider]  benzonatate  (TESSALON ) 100 MG capsule Take 1 capsule (100 mg total) by mouth every 8 (eight) hours as needed for cough. 06/18/23    Hazen Darryle BRAVO, FNP  HYDROcodone -homatropine (HYCODAN) 5-1.5 MG/5ML syrup Take 5 mLs by mouth every 6 (six) hours as needed for cough. 09/03/17   Rolinda Rogue, MD  levonorgestrel (MIRENA, 52 MG,) 20 MCG/24HR IUD Mirena 20 mcg/24 hr (5 years) intrauterine device  Take 1 device every day by intrauterine route.    [provider]  sertraline  (ZOLOFT ) 100 MG tablet Take 1 tablet (100 mg total) by mouth daily. Patient taking differently: Take 150 mg by mouth daily. 06/15/17   Juliane Che, PA  varenicline  (CHANTIX ) 1 MG tablet Take 1 tablet (1 mg total) by mouth 2 (two) times daily. 08/14/17   Juliane Che, PA  VRAYLAR 1.5 MG capsule Take 1.5 mg by mouth daily.    [provider]    Family History Family History  Problem Relation Age of Onset   Arthritis Mother    Diabetes Father    Cancer Maternal Uncle     Social History Social History[1]   Allergies   Patient has no known allergies.   Review of Systems Review of Systems  Per HPI  Physical Exam Triage Vital Signs ED Triage Vitals  Encounter Vitals Group     BP 09/08/24 1836 135/83     Girls Systolic BP Percentile --      Girls Diastolic BP Percentile --      Boys Systolic BP Percentile --      Boys Diastolic BP Percentile --      Pulse Rate 09/08/24 1836 92     Resp 09/08/24 1836 15     Temp 09/08/24 1836 98.8 F (37.1 C)     Temp Source 09/08/24 1836 Oral     SpO2 09/08/24 1836 96 %     Weight 09/08/24 1836 180 lb (81.6 kg)     Height --      Head Circumference --      Peak Flow --      Pain Score 09/08/24 1854 0     Pain Loc --      Pain Education --      Exclude from Growth Chart --    No data found.  Updated Vital Signs BP 135/83 (BP Location: Right Arm)   Pulse 92   Temp 98.8 F (37.1 C) (Oral)   Resp 15   Wt 180 lb (81.6 kg)   SpO2 96%   BMI 36.36 kg/m   Visual Acuity Right Eye Distance:   Left Eye Distance:   Bilateral Distance:    Right Eye Near:   Left Eye Near:     Bilateral Near:     Physical Exam Vitals and nursing note reviewed.  Constitutional:      Appearance: Normal appearance.  HENT:     Head: Normocephalic and atraumatic.     Right Ear: External ear normal.     Left Ear: External ear normal.     Nose: Nose normal.     Mouth/Throat:     Mouth: Mucous membranes are moist.  Eyes:     Conjunctiva/sclera: Conjunctivae normal.  Cardiovascular:     Rate and Rhythm: Normal rate.  Pulmonary:     Effort: Pulmonary effort is normal. No respiratory distress.  Abdominal:     Tenderness: There is no right CVA tenderness or left CVA tenderness.  Skin:    General: Skin is warm and dry.  Neurological:     General: No focal deficit present.     Mental Status: She is alert.  Psychiatric:        Mood and Affect: Mood normal.      UC Treatments / Results  Labs (all labs ordered are listed, but only abnormal results are displayed) Labs Reviewed  POCT URINE DIPSTICK - Abnormal; Notable for the following components:      Result Value   Color, UA light yellow (*)    Clarity, UA cloudy (*)    Spec Grav, UA <=1.005 (*)    Blood, UA large (*)    Leukocytes, UA Small (1+) (*)    All other components within normal limits  URINE CULTURE    EKG   Radiology No results found.  Procedures Procedures (including critical care time)  Medications Ordered in UC Medications - No data to display  Initial Impression / Assessment and Plan / UC Course  I have reviewed the triage vital signs and the nursing notes.  Pertinent labs & imaging results that were available during my care of the patient were reviewed by me and considered in my medical decision making (see chart for details).  Vitals and triage reviewed, patient is hemodynamically  stable.  Afebrile and without tachycardia, negative for CVA tenderness.  Low clinical concern for pyelonephritis.  UA does have large red blood cells and small leukocytes, will send for culture.  Due to symptoms  and leukocytes will cover with Bactrim  for acute cystitis.  Staff will contact if treatment modification is needed.  Pain management discussed.  Plan of care, follow-up care and return precautions given, no questions at this time.    Final Clinical Impressions(s) / UC Diagnoses   Final diagnoses:  Dysuria  Acute cystitis without hematuria     Discharge Instructions      Take the Bactrim  twice daily with food for the next 3 days You can also take over-the-counter Azo to help numb the urinary tract, this may change the color of your urine Ensure you are drinking at least 64 ounces of water daily to help flush the kidneys  We will contact you if we need to change your antibiotics based on your urine culture.  Seek follow-up care if no improvement despite antibiotics or if you have any changes     ED Prescriptions     Medication Sig Dispense Auth. Provider   sulfamethoxazole -trimethoprim  (BACTRIM  DS) 800-160 MG tablet Take 1 tablet by mouth 2 (two) times daily for 3 days. 6 tablet Dreama, Shirlee Whitmire  N, FNP      PDMP not reviewed this encounter.    [1]  Social History Tobacco Use   Smoking status: Former    Current packs/day: 1.00    Types: Cigarettes   Smokeless tobacco: Never  Vaping Use   Vaping status: Every Day  Substance Use Topics   Alcohol use: No   Drug use: No     Dreama Katherene SAILOR, FNP 09/08/24 1929  "

## 2024-09-08 NOTE — ED Triage Notes (Addendum)
 Pt presents with c/o dysuria + urinary frequency/urgency. Symptoms began yesterday. Took an at-home UTI test this morning. It was positive for nitrates and leukocytes. Currently denies pain, states she feels overall discomfort in bladder/groin area. Pain is present with urination.

## 2024-09-10 ENCOUNTER — Ambulatory Visit (HOSPITAL_COMMUNITY): Payer: Self-pay

## 2024-09-10 LAB — URINE CULTURE: Culture: >=100000 — AB
